# Patient Record
Sex: Male | Born: 1990 | Race: White | Hispanic: No | Marital: Married | Smoking: Never smoker
Health system: Southern US, Community
[De-identification: ages and names within clinical notes are randomized; demographics above are authoritative.]

## PROBLEM LIST (undated history)

## (undated) DIAGNOSIS — F909 Attention-deficit hyperactivity disorder, unspecified type: Secondary | ICD-10-CM

## (undated) DIAGNOSIS — J45909 Unspecified asthma, uncomplicated: Secondary | ICD-10-CM

## (undated) DIAGNOSIS — R030 Elevated blood-pressure reading, without diagnosis of hypertension: Secondary | ICD-10-CM

## (undated) DIAGNOSIS — K649 Unspecified hemorrhoids: Secondary | ICD-10-CM

## (undated) HISTORY — PX: ADENOIDECTOMY: SUR15

---
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
## 2012-02-12 DIAGNOSIS — F902 Attention-deficit hyperactivity disorder, combined type: Secondary | ICD-10-CM | POA: Insufficient documentation

## 2012-02-12 DIAGNOSIS — J301 Allergic rhinitis due to pollen: Secondary | ICD-10-CM | POA: Insufficient documentation

## 2012-02-12 DIAGNOSIS — J45909 Unspecified asthma, uncomplicated: Secondary | ICD-10-CM | POA: Insufficient documentation

## 2013-11-03 DIAGNOSIS — G4733 Obstructive sleep apnea (adult) (pediatric): Secondary | ICD-10-CM | POA: Insufficient documentation

## 2015-09-08 DIAGNOSIS — F5102 Adjustment insomnia: Secondary | ICD-10-CM | POA: Insufficient documentation

## 2015-09-08 DIAGNOSIS — G4733 Obstructive sleep apnea (adult) (pediatric): Secondary | ICD-10-CM | POA: Diagnosis not present

## 2015-09-08 DIAGNOSIS — Z79899 Other long term (current) drug therapy: Secondary | ICD-10-CM | POA: Diagnosis not present

## 2015-09-08 DIAGNOSIS — F902 Attention-deficit hyperactivity disorder, combined type: Secondary | ICD-10-CM | POA: Diagnosis not present

## 2016-01-01 DIAGNOSIS — F4322 Adjustment disorder with anxiety: Secondary | ICD-10-CM | POA: Diagnosis not present

## 2016-01-15 DIAGNOSIS — H5203 Hypermetropia, bilateral: Secondary | ICD-10-CM | POA: Diagnosis not present

## 2016-01-29 DIAGNOSIS — F4322 Adjustment disorder with anxiety: Secondary | ICD-10-CM | POA: Diagnosis not present

## 2016-02-08 DIAGNOSIS — J4520 Mild intermittent asthma, uncomplicated: Secondary | ICD-10-CM | POA: Diagnosis not present

## 2016-02-08 DIAGNOSIS — R202 Paresthesia of skin: Secondary | ICD-10-CM | POA: Diagnosis not present

## 2016-02-08 DIAGNOSIS — F5102 Adjustment insomnia: Secondary | ICD-10-CM | POA: Diagnosis not present

## 2016-02-08 DIAGNOSIS — F902 Attention-deficit hyperactivity disorder, combined type: Secondary | ICD-10-CM | POA: Diagnosis not present

## 2016-05-24 DIAGNOSIS — F902 Attention-deficit hyperactivity disorder, combined type: Secondary | ICD-10-CM | POA: Diagnosis not present

## 2016-05-24 DIAGNOSIS — R6889 Other general symptoms and signs: Secondary | ICD-10-CM | POA: Diagnosis not present

## 2016-06-05 ENCOUNTER — Emergency Department
Admission: EM | Admit: 2016-06-05 | Discharge: 2016-06-05 | Disposition: A | Discharge status: Home / Self Care | Payer: 59 | Attending: Emergency Medicine | Admitting: Emergency Medicine

## 2016-06-05 ENCOUNTER — Encounter: Payer: Self-pay | Admitting: Emergency Medicine

## 2016-06-05 DIAGNOSIS — F909 Attention-deficit hyperactivity disorder, unspecified type: Secondary | ICD-10-CM | POA: Diagnosis not present

## 2016-06-05 DIAGNOSIS — J45909 Unspecified asthma, uncomplicated: Secondary | ICD-10-CM | POA: Diagnosis not present

## 2016-06-05 DIAGNOSIS — K644 Residual hemorrhoidal skin tags: Secondary | ICD-10-CM | POA: Insufficient documentation

## 2016-06-05 DIAGNOSIS — K625 Hemorrhage of anus and rectum: Secondary | ICD-10-CM

## 2016-06-05 DIAGNOSIS — K649 Unspecified hemorrhoids: Secondary | ICD-10-CM

## 2016-06-05 HISTORY — DX: Unspecified hemorrhoids: K64.9

## 2016-06-05 HISTORY — DX: Elevated blood-pressure reading, without diagnosis of hypertension: R03.0

## 2016-06-05 HISTORY — DX: Unspecified asthma, uncomplicated: J45.909

## 2016-06-05 HISTORY — DX: Attention-deficit hyperactivity disorder, unspecified type: F90.9

## 2016-06-05 LAB — TYPE AND SCREEN
ABO/RH(D): O POS
Antibody Screen: NEGATIVE

## 2016-06-05 LAB — COMPREHENSIVE METABOLIC PANEL
ALT: 43 U/L (ref 17–63)
ANION GAP: 6 (ref 5–15)
AST: 29 U/L (ref 15–41)
Albumin: 4.3 g/dL (ref 3.5–5.0)
Alkaline Phosphatase: 79 U/L (ref 38–126)
BUN: 13 mg/dL (ref 6–20)
CHLORIDE: 107 mmol/L (ref 101–111)
CO2: 26 mmol/L (ref 22–32)
Calcium: 9.4 mg/dL (ref 8.9–10.3)
Creatinine, Ser: 0.82 mg/dL (ref 0.61–1.24)
Glucose, Bld: 92 mg/dL (ref 65–99)
POTASSIUM: 4.2 mmol/L (ref 3.5–5.1)
Sodium: 139 mmol/L (ref 135–145)
Total Bilirubin: 0.5 mg/dL (ref 0.3–1.2)
Total Protein: 8.3 g/dL — ABNORMAL HIGH (ref 6.5–8.1)

## 2016-06-05 LAB — CBC
HCT: 47.8 % (ref 40.0–52.0)
Hemoglobin: 16.3 g/dL (ref 13.0–18.0)
MCH: 28.8 pg (ref 26.0–34.0)
MCHC: 34.2 g/dL (ref 32.0–36.0)
MCV: 84.1 fL (ref 80.0–100.0)
Platelets: 246 10*3/uL (ref 150–440)
RBC: 5.68 MIL/uL (ref 4.40–5.90)
RDW: 13.1 % (ref 11.5–14.5)
WBC: 6.8 10*3/uL (ref 3.8–10.6)

## 2016-06-05 NOTE — ED Notes (Signed)

## 2016-06-05 NOTE — ED Triage Notes (Signed)
Pt presents to ED c/o rectal bleeding x1.5 wk. Pt states he has had bleeding from hemorrhoids in the past but it has been less then half the current quantity per day and only lasted 1 day. Denies SOB, dizziness, or lightheadedness.

## 2016-06-05 NOTE — Discharge Instructions (Signed)
Follow-up with your primary doctor or general surgery.  You were seen in the emergency room for abdominal pain. It is important that you follow up closely with your primary care doctor in the next couple of days.  Please return to the emergency room right away if you are to develop a fever, severe nausea, severe or concerning abdominal pain, you are unable to keep food down, begin vomiting any dark or bloody fluid, you develop any worsening of a dark or bloody stools, feel dehydrated, or other new concerns or symptoms arise.

## 2016-06-05 NOTE — ED Provider Notes (Signed)
[facility] Emergency Department Provider Note   ____________________________________________   First MD Initiated Contact with Patient 06/05/16 1403     (approximate)  I have reviewed the triage vital signs and the nursing notes.   HISTORY  Chief Complaint Rectal Bleeding    HPI Roger Bender is a 26 y.o. male reports that he had "a flulike illness" about 2 weeks ago. He had diarrhea during that time, all the symptoms are resolved. Reports he's often constipated. He had been using Imodium. In the past he has had hemorrhoids which have caused some small amounts of bleeding  Reports he thinks he might of bleeding hemorrhoids again, he has had about a bowel movement daily for the last 5 days where he is noticed small drops of blood around his bottom and in the toilet after stooling. Tends to go away after he stands up. No nausea or vomiting. No fevers or chills. No lightheadedness chest pain or weakness. Describes the blood as a small amount, but happening frequently for the last several days  Rectal foreign bodies. No history of early colon cancers. Denies any trauma or injury.   Past Medical History:  Diagnosis Date  . ADHD   . Asthma   . Hemorrhoids   . Prehypertension     Patient Active Problem List   Diagnosis Date Noted  . ATTENTION DEFICIT DISORDER, INATTENTIVE TYPE 10/06/2006  . ASTHMA 10/06/2006    Past Surgical History:  Procedure Laterality Date  . ADENOIDECTOMY      Prior to Admission medications   Not on File    Allergies Patient has no known allergies.  History reviewed. No pertinent family history.  Social History Social History  Substance Use Topics  . Smoking status: Never Smoker  . Smokeless tobacco: Never Used  . Alcohol use No    Review of Systems Constitutional: No fever/chills ENT: No sore throat. Cardiovascular: Denies chest pain. Respiratory: Denies shortness of breath. Gastrointestinal: No abdominal  pain.  No nausea, no vomiting.  No diarrhea.  No constipation. Genitourinary: Negative for dysuria. Musculoskeletal: Negative for back pain. Skin: Negative for rash.  10-point ROS otherwise negative.  ____________________________________________   PHYSICAL EXAM:  VITAL SIGNS: ED Triage Vitals  Enc Vitals Group     BP 06/05/16 1153 (!) 145/93     Pulse Rate 06/05/16 1153 88     Resp 06/05/16 1153 18     Temp 06/05/16 1153 98.9 F (37.2 C)     Temp Source 06/05/16 1153 Oral     SpO2 06/05/16 1153 98 %     Weight 06/05/16 1153 250 lb (113.4 kg)     Height 06/05/16 1153 6' (1.829 m)     Head Circumference --      Peak Flow --      Pain Score 06/05/16 1156 1     Pain Loc --      Pain Edu? --      Excl. in GC? --     Constitutional: Alert and oriented. Well appearing and in no acute distress. Eyes: Conjunctivae are normal. PERRL. EOMI. Head: Atraumatic. Nose: No congestion/rhinnorhea. Mouth/Throat: Mucous membranes are moist.  Oropharynx non-erythematous. Neck: No stridor.   Cardiovascular: Normal rate, regular rhythm. Grossly normal heart sounds.  Good peripheral circulation. Respiratory: Normal respiratory effort.  No retractions. Lungs CTAB. Gastrointestinal: Soft and nontender.  Rectal examination demonstrates no stool in the vault presently. Heme-negative stool. No bleeding at this time. There is a small remainder of an  old external hemorrhoid. Musculoskeletal: No lower extremity tenderness nor edema.   Neurologic:  Normal speech and language. No gross focal neurologic deficits are appreciated. Skin:  Skin is warm, dry and intact. No rash noted. Psychiatric: Mood and affect are normal. Speech and behavior are normal.  ____________________________________________   LABS (all labs ordered are listed, but only abnormal results are displayed)  Labs Reviewed  COMPREHENSIVE METABOLIC PANEL - Abnormal; Notable for the following:       Result Value   Total Protein 8.3 (*)     All other components within normal limits  CBC  POC OCCULT BLOOD, ED  TYPE AND SCREEN   ____________________________________________  EKG   ____________________________________________  RADIOLOGY   ____________________________________________   PROCEDURES  Procedure(s) performed: None  Procedures  Critical Care performed: No  ____________________________________________   INITIAL IMPRESSION / ASSESSMENT AND PLAN / ED COURSE  Pertinent labs & imaging results that were available during my care of the patient were reviewed by me and considered in my medical decision making (see chart for details).  Rectal bleeding. Appears mild, likely due to internal hemorrhoids or other hemorrhoidal source. Patient hemodynamically stable, no evidence of ongoing bleeding at this time. No associated abdominal pain or systemic symptoms. Blood counts reviewed and reassuring/normal.  Discussed with patient treatment recommendations, traditional hemorrhoid treatment, and also advised follow-up with his primary care doctor as well as general surgery for further workup including a recommendation to consider having a colonoscopy after being seen and reevaluated by his primary.  Return precautions and treatment recommendations and follow-up discussed with the patient who is agreeable with the plan.      ____________________________________________   FINAL CLINICAL IMPRESSION(S) / ED DIAGNOSES  Final diagnoses:  Rectal bleeding  Residual hemorrhoidal skin tags  Hemorrhoids, unspecified hemorrhoid type      NEW MEDICATIONS STARTED DURING THIS VISIT:  New Prescriptions   No medications on file     Note:  This document was prepared using Dragon voice recognition software and may include unintentional dictation errors.     Sharyn CreamerMark Matilynn Dacey, MD 06/05/16 [phone]

## 2017-06-25 DIAGNOSIS — F5101 Primary insomnia: Secondary | ICD-10-CM | POA: Insufficient documentation

## 2018-04-13 ENCOUNTER — Ambulatory Visit (INDEPENDENT_AMBULATORY_CARE_PROVIDER_SITE_OTHER): Payer: BC Managed Care – PPO

## 2018-04-13 ENCOUNTER — Ambulatory Visit
Admission: EM | Admit: 2018-04-13 | Discharge: 2018-04-13 | Disposition: A | Discharge status: Home / Self Care | Payer: BC Managed Care – PPO | Attending: Family Medicine | Admitting: Family Medicine

## 2018-04-13 DIAGNOSIS — I10 Essential (primary) hypertension: Secondary | ICD-10-CM

## 2018-04-13 DIAGNOSIS — J069 Acute upper respiratory infection, unspecified: Secondary | ICD-10-CM

## 2018-04-13 DIAGNOSIS — J4520 Mild intermittent asthma, uncomplicated: Secondary | ICD-10-CM | POA: Diagnosis not present

## 2018-04-13 LAB — RAPID STREP SCREEN (MED CTR MEBANE ONLY): Streptococcus, Group A Screen (Direct): NEGATIVE

## 2018-04-13 MED ORDER — BENZONATATE 200 MG PO CAPS: ORAL_CAPSULE | ORAL | 0 refills | Status: DC

## 2018-04-13 MED ORDER — HYDROCOD POLST-CPM POLST ER 10-8 MG/5ML PO SUER: 5.0000 mL | Freq: Two times a day (BID) | ORAL | 0 refills | Status: DC

## 2018-04-13 MED ORDER — AZITHROMYCIN 250 MG PO TABS: 250.0000 mg | ORAL_TABLET | Freq: Every day | ORAL | 0 refills | Status: DC

## 2018-04-13 NOTE — Discharge Instructions (Addendum)
Drink plenty of fluids.  Rest as much as possible.  Use Tylenol or Motrin for fever chills or body aches.  °

## 2018-04-13 NOTE — ED Provider Notes (Signed)
[facility]    CSN: [id number] Arrival date & time: 04/13/18  1243     History   Chief Complaint Chief Complaint  Patient presents with  . Chills    HPI Roger Bender is a 28 y.o. male.   HPI  28 year old male history of asthma and obstructive sleep apnea with cold sweats that he had at night for the past 5 days.  White patches on his tongue and throat it became more concerned for strep.  He also complains of headache nausea and fatigue.  Question today about his worst symptom it is coughing that he is most concerned about and bothered with.  Had shaking chills but has not taken his temperature.  He is used his albuterol inhaler only twice.  Temperature today is 99.3 pulse rate of 123 blood pressure 134/107 respirations 18 O2 sats of 97%.  He states that he did receive his flu shot in November.        Past Medical History:  Diagnosis Date  . ADHD   . Asthma   . Hemorrhoids   . Prehypertension     Patient Active Problem List   Diagnosis Date Noted  . Primary insomnia 06/25/2017  . OSA (obstructive sleep apnea) 11/03/2013  . Asthma 02/12/2012  . Hay fever 02/12/2012  . ATTENTION DEFICIT DISORDER, INATTENTIVE TYPE 10/06/2006  . ASTHMA 10/06/2006    Past Surgical History:  Procedure Laterality Date  . ADENOIDECTOMY         Home Medications    Prior to Admission medications   Medication Sig Start Date End Date Taking? Authorizing Provider  albuterol (PROVENTIL HFA;VENTOLIN HFA) 108 (90 Base) MCG/ACT inhaler INHALE 2 PUFFS BY MOUTH INTO THE LUNGS EVERY 4 HOURS AS NEEDED 08/01/17  Yes [provider]  amphetamine-dextroamphetamine (ADDERALL) 20 MG tablet Take by mouth. 02/04/18 05/06/18 Yes [provider]  traZODone (DESYREL) 100 MG tablet Take by mouth. 09/29/17 09/29/18 Yes [provider]  azithromycin (ZITHROMAX) 250 MG tablet Take 1 tablet (250 mg total) by mouth daily. Take first 2 tablets together, then 1 every day until  finished. 04/13/18   Lutricia Feil, PA-C  benzonatate (TESSALON) 200 MG capsule Take one cap TID PRN cough 04/13/18   Lutricia Feil, PA-C  chlorpheniramine-HYDROcodone [facility]) 10-8 MG/5ML SUER Take 5 mLs by mouth 2 (two) times daily. 04/13/18   Lutricia Feil, PA-C    Family History Family History  Problem Relation Age of Onset  . Healthy Mother   . Depression Father     Social History Social History   Tobacco Use  . Smoking status: Never Smoker  . Smokeless tobacco: Never Used  Substance Use Topics  . Alcohol use: No  . Drug use: No     Allergies   Patient has no known allergies.   Review of Systems Review of Systems  Constitutional: Positive for activity change, chills and fatigue. Negative for appetite change, diaphoresis and fever.  HENT: Positive for congestion and mouth sores.   Respiratory: Positive for cough and shortness of breath.   All other systems reviewed and are negative.    Physical Exam Triage Vital Signs ED Triage Vitals  Enc Vitals Group     BP 04/13/18 1258 (!) 134/107     Pulse Rate 04/13/18 1257 (!) 123     Resp 04/13/18 1257 18     Temp 04/13/18 1257 99.3 F (37.4 C)     Temp Source 04/13/18 1257 Oral  SpO2 04/13/18 1257 97 %     Weight 04/13/18 1259 260 lb (117.9 kg)     Height 04/13/18 1259 5' 11.5" (1.816 m)     Head Circumference --      Peak Flow --      Pain Score 04/13/18 1259 0     Pain Loc --      Pain Edu? --      Excl. in GC? --    No data found.  Updated Vital Signs BP (!) 134/107   Pulse (!) 123   Temp 99.3 F (37.4 C) (Oral)   Resp 18   Ht 5' 11.5" (1.816 m)   Wt 260 lb (117.9 kg)   SpO2 97%   BMI 35.76 kg/m   Visual Acuity Right Eye Distance:   Left Eye Distance:   Bilateral Distance:    Right Eye Near:   Left Eye Near:    Bilateral Near:     Physical Exam Vitals signs and nursing note reviewed.  Constitutional:      General: He is not in acute distress.     Appearance: Normal appearance. He is obese. He is ill-appearing. He is not toxic-appearing or diaphoretic.  HENT:     Head: Normocephalic.     Right Ear: Tympanic membrane, ear canal and external ear normal.     Left Ear: Tympanic membrane, ear canal and external ear normal.     Nose: Nose normal. No congestion or rhinorrhea.     Mouth/Throat:     Mouth: Mucous membranes are moist.  Eyes:     General:        Right eye: No discharge.        Left eye: No discharge.     Conjunctiva/sclera: Conjunctivae normal.  Neck:     Musculoskeletal: Normal range of motion and neck supple.  Pulmonary:     Effort: Pulmonary effort is normal.     Breath sounds: Rhonchi present.     Comments: Patient has very harsh breath sounds over the right middle lower lobe. Musculoskeletal: Normal range of motion.  Lymphadenopathy:     Cervical: No cervical adenopathy.  Skin:    General: Skin is warm and dry.  Neurological:     General: No focal deficit present.     Mental Status: He is alert and oriented to person, place, and time.  Psychiatric:        Mood and Affect: Mood normal.        Behavior: Behavior normal.        Thought Content: Thought content normal.        Judgment: Judgment normal.      UC Treatments / Results  Labs (all labs ordered are listed, but only abnormal results are displayed) Labs Reviewed  RAPID STREP SCREEN ([facility] ONLY)  CULTURE, GROUP A STREP [facility])    EKG None  Radiology Dg Chest 2 View  Result Date: 04/13/2018 CLINICAL DATA:  28 year old male with cough EXAM: CHEST - 2 VIEW COMPARISON:  Prior chest x-ray 11/30/2007 FINDINGS: The lungs are clear and negative for focal airspace consolidation, pulmonary edema or suspicious pulmonary nodule. No pleural effusion or pneumothorax. Cardiac and mediastinal contours are within normal limits. No acute fracture or lytic or blastic osseous lesions. The visualized upper abdominal bowel gas pattern is unremarkable.  IMPRESSION: Normal chest x-ray. Electronically Signed   By: Malachy MoanHeath  McCullough M.D.   On: 04/13/2018 15:10    Procedures Procedures (including critical care time)  Medications Ordered in UC Medications - No data to display  Initial Impression / Assessment and Plan / UC Course  I have reviewed the triage vital signs and the nursing notes.  Pertinent labs & imaging results that were available during my care of the patient were reviewed by me and considered in my medical decision making (see chart for details).   The patient he likely may have had flulike symptoms.  To late actually to initiate any antiviral medication.  However he does have the history of asthma in the tachycardia with 97% O2 sat.  We will start him empirically on azithromycin.  We will provide him with cough suppressants with Tessalon Perles and Tussionex.  Cultures and sensitivities of the pharynx will be available in 48 hours.  Also recommended that he use Flonase nasal spray for 2 to 3 weeks.   Final Clinical Impressions(s) / UC Diagnoses   Final diagnoses:  Upper respiratory tract infection, unspecified type  Mild intermittent asthma without complication     Discharge Instructions     Drink plenty of fluids.  Rest as much as possible.  Use Tylenol or Motrin for fever chills or body aches.    ED Prescriptions    Medication Sig Dispense Auth. Provider   azithromycin (ZITHROMAX) 250 MG tablet Take 1 tablet (250 mg total) by mouth daily. Take first 2 tablets together, then 1 every day until finished. 6 tablet Lutricia Feil, PA-C   benzonatate (TESSALON) 200 MG capsule Take one cap TID PRN cough 30 capsule Lutricia Feil, PA-C   chlorpheniramine-HYDROcodone (TUSSIONEX PENNKINETIC ER) 10-8 MG/5ML SUER Take 5 mLs by mouth 2 (two) times daily. 115 mL Lutricia Feil, PA-C     Controlled Substance Prescriptions [place] Controlled Substance Registry consulted? Not Applicable   Lutricia Feil, PA-C 04/13/18  1628

## 2018-04-13 NOTE — ED Triage Notes (Signed)
Pt here for cold sweats at night for the past 5 days. Want to rule out strep, has white patches on his tongue. Also has headache, nausea and fatigue. No otc meds taken, no fever reported.

## 2018-04-16 ENCOUNTER — Telehealth (HOSPITAL_COMMUNITY): Payer: Self-pay | Admitting: Emergency Medicine

## 2018-04-16 LAB — CULTURE, GROUP A STREP (THRC)

## 2018-04-16 NOTE — Telephone Encounter (Signed)
Non group A strep on culture, pt contacted states he is feeling better but cough still is lingering, asking if its okay to double up on the cough syrup. Per Dr. Leonides Grills, okay to double up on cough medicine and to follow up with PCP if symptoms persist. Pt verbalized understanding and had all questions answered.

## 2018-09-07 ENCOUNTER — Other Ambulatory Visit: Payer: Self-pay

## 2018-09-07 ENCOUNTER — Ambulatory Visit (INDEPENDENT_AMBULATORY_CARE_PROVIDER_SITE_OTHER): Payer: BC Managed Care – PPO

## 2018-09-07 ENCOUNTER — Ambulatory Visit
Admission: EM | Admit: 2018-09-07 | Discharge: 2018-09-07 | Disposition: A | Discharge status: Home / Self Care | Payer: BC Managed Care – PPO | Attending: Emergency Medicine | Admitting: Emergency Medicine

## 2018-09-07 DIAGNOSIS — S93402A Sprain of unspecified ligament of left ankle, initial encounter: Secondary | ICD-10-CM

## 2018-09-07 DIAGNOSIS — W1840XA Slipping, tripping and stumbling without falling, unspecified, initial encounter: Secondary | ICD-10-CM | POA: Diagnosis not present

## 2018-09-07 MED ORDER — IBUPROFEN 800 MG PO TABS: 800.0000 mg | ORAL_TABLET | Freq: Three times a day (TID) | ORAL | 0 refills | Status: AC

## 2018-09-07 NOTE — ED Provider Notes (Signed)
[facility]    CSN: [id number] Arrival date & time: 09/07/18  1314     History   Chief Complaint Chief Complaint  Patient presents with  . Ankle Injury    HPI Roger Bender is a 28 y.o. male presenting with pain to left foot and ankle. Pt was walking down stairs when he slipped and rolled his ankle inward.Injuiry happened approximately 90 mins ago. Pt denied hearing or feeling a pop or crack. Pt states he has decreased ability to bear weight due to injury. No medication/therapies have been tried since injury.  Past Medical History:  Diagnosis Date  . ADHD   . Asthma   . Hemorrhoids   . Prehypertension     Patient Active Problem List   Diagnosis Date Noted  . Primary insomnia 06/25/2017  . OSA (obstructive sleep apnea) 11/03/2013  . Asthma 02/12/2012  . Hay fever 02/12/2012  . ATTENTION DEFICIT DISORDER, INATTENTIVE TYPE 10/06/2006  . ASTHMA 10/06/2006    Past Surgical History:  Procedure Laterality Date  . ADENOIDECTOMY         Home Medications    Prior to Admission medications   Medication Sig Start Date End Date Taking? Authorizing Provider  albuterol (PROVENTIL HFA;VENTOLIN HFA) 108 (90 Base) MCG/ACT inhaler INHALE 2 PUFFS BY MOUTH INTO THE LUNGS EVERY 4 HOURS AS NEEDED 08/01/17  Yes [provider]  amphetamine-dextroamphetamine (ADDERALL) 20 MG tablet Take by mouth. 02/04/18 09/07/18 Yes [provider]  escitalopram (LEXAPRO) 20 MG tablet  08/18/18  Yes [provider]  gabapentin (NEURONTIN) 100 MG capsule TK 1 TO 3 CS PO QD PRA 07/21/18  Yes [provider]  guanFACINE (INTUNIV) 2 MG TB24 ER tablet TK 1 T PO QAM 07/21/18  Yes [provider]  LORazepam (ATIVAN) 1 MG tablet  05/08/18  Yes [provider]  traZODone (DESYREL) 100 MG tablet Take by mouth. 09/29/17 09/29/18 Yes [provider]  azithromycin (ZITHROMAX) 250 MG tablet Take 1 tablet (250 mg total) by mouth daily. Take first 2  tablets together, then 1 every day until finished. 04/13/18   Lorin Picket, PA-C  benzonatate (TESSALON) 200 MG capsule Take one cap TID PRN cough 04/13/18   Lorin Picket, PA-C  chlorpheniramine-HYDROcodone [facility]) 10-8 MG/5ML SUER Take 5 mLs by mouth 2 (two) times daily. 04/13/18   Lorin Picket, PA-C  ibuprofen (ADVIL) 800 MG tablet Take 1 tablet (800 mg total) by mouth 3 (three) times daily with meals for 10 days. 09/07/18 09/17/18  Gertie Baron, NP    Family History Family History  Problem Relation Age of Onset  . Healthy Mother   . Depression Father     Social History Social History   Tobacco Use  . Smoking status: Never Smoker  . Smokeless tobacco: Never Used  Substance Use Topics  . Alcohol use: No  . Drug use: No     Allergies   Patient has no known allergies.   Review of Systems Review of Systems  Musculoskeletal: Positive for arthralgias. Negative for joint swelling.  Skin: Negative for color change and pallor.  All other systems reviewed and are negative.    Physical Exam Triage Vital Signs ED Triage Vitals  Enc Vitals Group     BP 09/07/18 1349 (!) 134/96     Pulse Rate 09/07/18 1349 88     Resp 09/07/18 1349 18     Temp 09/07/18 1349 98.8 F (37.1 C)  Temp Source 09/07/18 1349 Oral     SpO2 09/07/18 1349 99 %     Weight 09/07/18 1347 260 lb (117.9 kg)     Height 09/07/18 1347 6' (1.829 m)     Head Circumference --      Peak Flow --      Pain Score 09/07/18 1347 7     Pain Loc --      Pain Edu? --      Excl. in GC? --    No data found.  Updated Vital Signs BP (!) 134/96 (BP Location: Left Arm)   Pulse 88   Temp 98.8 F (37.1 C) (Oral)   Resp 18   Ht 6' (1.829 m)   Wt 260 lb (117.9 kg)   SpO2 99%   BMI 35.26 kg/m    Physical Exam Vitals signs reviewed.  Musculoskeletal: Normal range of motion.        General: Tenderness present. No swelling.     Comments: Decreased ability to dorsal flex foot due  to pain. Tenderness to 3rd metatarsal and dorsal aspect.   Skin:    General: Skin is warm and dry.     Capillary Refill: Capillary refill takes less than 2 seconds.     Findings: No bruising or erythema.      UC Treatments / Results  Labs (all labs ordered are listed, but only abnormal results are displayed) Labs Reviewed - No data to display  EKG None  Radiology Dg Ankle Complete Left  Result Date: 09/07/2018 CLINICAL DATA:  Anterior ankle and dorsal foot pain after falling down steps today. Initial encounter. EXAM: LEFT ANKLE COMPLETE - 3+ VIEW COMPARISON:  None. FINDINGS: The mineralization and alignment are normal. There is no evidence of acute fracture or dislocation. The joint spaces are preserved. No focal soft tissue swelling identified. IMPRESSION: No evidence of acute fracture or dislocation. Electronically Signed   By: Carey BullocksWilliam  Veazey M.D.   On: 09/07/2018 14:46   Dg Foot Complete Left  Result Date: 09/07/2018 CLINICAL DATA:  Anterior ankle and dorsal foot pain after falling down steps today. Initial encounter. EXAM: LEFT FOOT - COMPLETE 3+ VIEW COMPARISON:  None. FINDINGS: The mineralization and alignment are normal. There is no evidence of acute fracture or dislocation. The joint spaces are preserved. No focal soft tissue swelling identified. IMPRESSION: No evidence of acute fracture or dislocation. Electronically Signed   By: Carey BullocksWilliam  Veazey M.D.   On: 09/07/2018 14:47    Procedures Procedures (including critical care time)  Medications Ordered in UC Medications - No data to display  Initial Impression / Assessment and Plan / UC Course  I have reviewed the triage vital signs and the nursing notes.  Pertinent labs & imaging results that were available during my care of the patient were reviewed by me and considered in my medical decision making (see chart for details).    Pt presented with foot and ankle post fall this afternoon. Subsequent Xray show no acute  osseous injury. Pain and mechanism of injury consistent with an ankle sprain/ Pt educated on RICE therapy, given a lace-up ankle splint in office and prescribed 800mg  ibuprofen TID #30. Pt to follow-up with PCP if pain is unchanging/worseing. All questions answered and all concerns addressed.   Final Clinical Impressions(s) / UC Diagnoses   Final diagnoses:  Sprain of left ankle, unspecified ligament, initial encounter   Discharge Instructions   None    ED Prescriptions    Medication Sig Dispense Auth.  Provider   ibuprofen (ADVIL) 800 MG tablet Take 1 tablet (800 mg total) by mouth 3 (three) times daily with meals for 10 days. 30 tablet Bailey MechBenjamin, Navayah Sok, NP       Bailey MechBenjamin, Joshau Code, NP 09/07/18 [phone]

## 2018-09-07 NOTE — ED Triage Notes (Signed)
Patient states that he rolled his left ankle while walking down some steps at [employer]. Patient states that area hurts to put weight on it and hurts to move his toes. States that this happened around 1.5 hours ago.

## 2019-09-04 ENCOUNTER — Other Ambulatory Visit: Payer: Self-pay

## 2019-09-04 ENCOUNTER — Ambulatory Visit
Admission: EM | Admit: 2019-09-04 | Discharge: 2019-09-04 | Disposition: A | Discharge status: Home / Self Care | Payer: BC Managed Care – PPO | Attending: Emergency Medicine | Admitting: Emergency Medicine

## 2019-09-04 ENCOUNTER — Encounter: Payer: Self-pay | Admitting: Emergency Medicine

## 2019-09-04 DIAGNOSIS — R05 Cough: Secondary | ICD-10-CM | POA: Insufficient documentation

## 2019-09-04 DIAGNOSIS — J4521 Mild intermittent asthma with (acute) exacerbation: Secondary | ICD-10-CM | POA: Diagnosis not present

## 2019-09-04 DIAGNOSIS — Z20822 Contact with and (suspected) exposure to covid-19: Secondary | ICD-10-CM | POA: Insufficient documentation

## 2019-09-04 DIAGNOSIS — R059 Cough, unspecified: Secondary | ICD-10-CM

## 2019-09-04 DIAGNOSIS — G4700 Insomnia, unspecified: Secondary | ICD-10-CM | POA: Insufficient documentation

## 2019-09-04 DIAGNOSIS — Z79899 Other long term (current) drug therapy: Secondary | ICD-10-CM | POA: Diagnosis not present

## 2019-09-04 DIAGNOSIS — R0981 Nasal congestion: Secondary | ICD-10-CM | POA: Diagnosis not present

## 2019-09-04 DIAGNOSIS — J0110 Acute frontal sinusitis, unspecified: Secondary | ICD-10-CM | POA: Insufficient documentation

## 2019-09-04 MED ORDER — BENZONATATE 200 MG PO CAPS: ORAL_CAPSULE | ORAL | 0 refills | Status: DC

## 2019-09-04 MED ORDER — AMOXICILLIN-POT CLAVULANATE 875-125 MG PO TABS: 1.0000 | ORAL_TABLET | Freq: Two times a day (BID) | ORAL | 0 refills | Status: AC

## 2019-09-04 MED ORDER — HYDROCOD POLST-CPM POLST ER 10-8 MG/5ML PO SUER: 5.0000 mL | Freq: Every evening | ORAL | 0 refills | Status: DC | PRN

## 2019-09-04 MED ORDER — ALBUTEROL SULFATE HFA 108 (90 BASE) MCG/ACT IN AERS: 2.0000 | INHALATION_SPRAY | Freq: Four times a day (QID) | RESPIRATORY_TRACT | 2 refills | Status: DC | PRN

## 2019-09-04 NOTE — ED Triage Notes (Signed)
Patient c/o cough and congestion for over a week.  Patient denies recent fevers.  

## 2019-09-04 NOTE — ED Provider Notes (Signed)
[facility]    CSN: [id number] Arrival date & time: 09/04/19  0802      History   Chief Complaint Chief Complaint  Patient presents with  . Cough    HPI Roger Bender is a 29 y.o. male.   29 year old male presents with nasal congestion, productive cough with yellow to green mucus for over 1 week. Had a slight fever last week but no fever now. Also having right ear fullness and sinus pressure/headaches. Denies any GI symptoms. Also has history of asthma and has noticed more wheezing in the past few days- has run out of his Albuterol inhaler. Has tried OTC cold medication and Nyquil with no relief. Father in law also sick with similar symptoms. Son was sick with URI symptoms last week but resolving now. He was positive for COVID 19 in Dec 2020 and current symptoms feel different. Has had both doses of COVID 19 vaccine. Other chronic health issues include ADHD and insomnia. Currently on Lexapro, Gabapentin, Guanfacine daily and Adderall prn.   The history is provided by the patient.    Past Medical History:  Diagnosis Date  . ADHD   . Asthma   . Hemorrhoids   . Prehypertension     Patient Active Problem List   Diagnosis Date Noted  . Primary insomnia 06/25/2017  . OSA (obstructive sleep apnea) 11/03/2013  . Asthma 02/12/2012  . Hay fever 02/12/2012  . ATTENTION DEFICIT DISORDER, INATTENTIVE TYPE 10/06/2006  . ASTHMA 10/06/2006    Past Surgical History:  Procedure Laterality Date  . ADENOIDECTOMY         Home Medications    Prior to Admission medications   Medication Sig Start Date End Date Taking? Authorizing Provider  amphetamine-dextroamphetamine (ADDERALL) 20 MG tablet Take by mouth. 02/04/18 09/04/19 Yes [provider]  escitalopram (LEXAPRO) 20 MG tablet  08/18/18  Yes [provider]  gabapentin (NEURONTIN) 100 MG capsule TK 1 TO 3 CS PO QD PRA 07/21/18  Yes [provider]  guanFACINE (INTUNIV) 2 MG TB24 ER tablet TK 1  T PO QAM 07/21/18  Yes [provider]  albuterol (VENTOLIN HFA) 108 (90 Base) MCG/ACT inhaler Inhale 2 puffs into the lungs every 6 (six) hours as needed for wheezing or shortness of breath. 09/04/19   Sudie Grumbling, NP  amoxicillin-clavulanate (AUGMENTIN) 875-125 MG tablet Take 1 tablet by mouth every 12 (twelve) hours for 7 days. 09/04/19 09/11/19  Sudie Grumbling, NP  benzonatate (TESSALON) 200 MG capsule Take one capsule by mouth three times a day as needed for cough 09/04/19   Sudie Grumbling, NP  chlorpheniramine-HYDROcodone (TUSSIONEX PENNKINETIC ER) 10-8 MG/5ML SUER Take 5 mLs by mouth at bedtime as needed for cough. 09/04/19   Sudie Grumbling, NP  traZODone (DESYREL) 100 MG tablet Take by mouth. 09/29/17 09/29/18  [provider]    Family History Family History  Problem Relation Age of Onset  . Healthy Mother   . Depression Father     Social History Social History   Tobacco Use  . Smoking status: Never Smoker  . Smokeless tobacco: Never Used  Vaping Use  . Vaping Use: Never used  Substance Use Topics  . Alcohol use: No  . Drug use: No     Allergies   Patient has no known allergies.   Review of Systems Review of Systems  Constitutional: Positive for fatigue. Negative for activity change, appetite change, chills and fever.  HENT: Positive for  congestion, ear pain (right ear fullness), postnasal drip, sinus pressure and sinus pain. Negative for ear discharge, facial swelling, mouth sores, nosebleeds, sneezing, sore throat and trouble swallowing.   Eyes: Negative for pain, discharge, redness and itching.  Respiratory: Positive for cough and wheezing. Negative for chest tightness.   Gastrointestinal: Negative for abdominal pain, diarrhea, nausea and vomiting.  Musculoskeletal: Negative for arthralgias, myalgias, neck pain and neck stiffness.  Skin: Negative for color change, rash and wound.  Allergic/Immunologic: Positive for environmental allergies.  Negative for food allergies and immunocompromised state.  Neurological: Positive for headaches. Negative for dizziness, seizures, syncope, weakness and light-headedness.  Hematological: Negative for adenopathy. Does not bruise/bleed easily.     Physical Exam Triage Vital Signs ED Triage Vitals  Enc Vitals Group     BP 09/04/19 0815 (!) 134/96     Pulse Rate 09/04/19 0815 82     Resp 09/04/19 0815 16     Temp 09/04/19 0815 98.1 F (36.7 C)     Temp Source 09/04/19 0815 Oral     SpO2 09/04/19 0815 97 %     Weight 09/04/19 0813 260 lb (117.9 kg)     Height 09/04/19 0813 5' 11.5" (1.816 m)     Head Circumference --      Peak Flow --      Pain Score 09/04/19 0812 4     Pain Loc --      Pain Edu? --      Excl. in GC? --    No data found.  Updated Vital Signs BP (!) 134/96 (BP Location: Left Arm)   Pulse 82   Temp 98.1 F (36.7 C) (Oral)   Resp 16   Ht 5' 11.5" (1.816 m)   Wt 260 lb (117.9 kg)   SpO2 97%   BMI 35.76 kg/m   Visual Acuity Right Eye Distance:   Left Eye Distance:   Bilateral Distance:    Right Eye Near:   Left Eye Near:    Bilateral Near:     Physical Exam Vitals and nursing note reviewed.  Constitutional:      General: He is awake. He is not in acute distress.    Appearance: He is well-developed and well-groomed. He is ill-appearing.     Comments: He is sitting on the exam table in no acute distress but appears ill.   HENT:     Head: Normocephalic and atraumatic.     Right Ear: Hearing, ear canal and external ear normal. No drainage. A middle ear effusion is present. Tympanic membrane is bulging. Tympanic membrane is not injected or erythematous.     Left Ear: Hearing, ear canal and external ear normal. No drainage. A middle ear effusion is present. Tympanic membrane is bulging. Tympanic membrane is not injected or erythematous.     Nose: Congestion present.     Right Sinus: Frontal sinus tenderness present. No maxillary sinus tenderness.     Left  Sinus: Frontal sinus tenderness present. No maxillary sinus tenderness.     Mouth/Throat:     Lips: Pink.     Mouth: Mucous membranes are moist.     Pharynx: Uvula midline. Oropharyngeal exudate (slight clear to yellow drainage present) and posterior oropharyngeal erythema present. No pharyngeal swelling or uvula swelling.  Eyes:     Extraocular Movements: Extraocular movements intact.     Conjunctiva/sclera: Conjunctivae normal.  Cardiovascular:     Rate and Rhythm: Normal rate and regular rhythm.     Heart sounds:  Normal heart sounds. No murmur heard.   Pulmonary:     Effort: Pulmonary effort is normal. No respiratory distress.     Breath sounds: Normal air entry. No stridor or decreased air movement. Examination of the right-upper field reveals decreased breath sounds and wheezing. Examination of the left-upper field reveals decreased breath sounds and wheezing. Decreased breath sounds and wheezing present. No rhonchi or rales.     Comments: Course breath sounds in upper lung fields.  Musculoskeletal:     Cervical back: Normal range of motion and neck supple. No rigidity.  Lymphadenopathy:     Cervical: No cervical adenopathy.  Skin:    General: Skin is warm and dry.     Capillary Refill: Capillary refill takes less than 2 seconds.     Findings: No rash.  Neurological:     General: No focal deficit present.     Mental Status: He is alert and oriented to person, place, and time.  Psychiatric:        Mood and Affect: Mood normal.        Behavior: Behavior normal. Behavior is cooperative.        Thought Content: Thought content normal.        Judgment: Judgment normal.      UC Treatments / Results  Labs (all labs ordered are listed, but only abnormal results are displayed) Labs Reviewed  SARS CORONAVIRUS 2 (TAT 6-24 HRS)    EKG   Radiology No results found.  Procedures Procedures (including critical care time)  Medications Ordered in UC Medications - No data to  display  Initial Impression / Assessment and Plan / UC Course  I have reviewed the triage vital signs and the nursing notes.  Pertinent labs & imaging results that were available during my care of the patient were reviewed by me and considered in my medical decision making (see chart for details).    Discussed with patient that he appears to have a frontal sinus infection with cough and wheezing associated with asthma exacerbation. Due to length and worsening of symptoms, recommend start Augmentin 875mg  twice a day as directed. Take Tessalon cough pills 1 every 8 hours as needed. Use Albuterol inhaler 2 puffs every 4 to 6 hours as needed for wheezing and cough. Increase fluid intake to help loosen up mucus in chest. May take Tussionex 1 teaspoon at bedtime as needed for cough. Follow-up pending COVID 19 test results and with his PCP in 3 to 4 days if not improving.  Final Clinical Impressions(s) / UC Diagnoses   Final diagnoses:  Acute non-recurrent frontal sinusitis  Cough  Mild intermittent asthma with exacerbation     Discharge Instructions     Recommend start Augmentin 875mg  twice a day for 7 days. May take Benzonatate 1 capsule every 8 hours as needed for cough. Increase fluid intake to help loosen up mucus in chest. Use Albuterol inhaler 2 puffs every 4 to 6 hours as needed for cough and wheezing. May also take Tussionex 1 teaspoon at night to help with cough. Rest. Follow-up pending COVID19 test results and in 3 to 4 days if not improving.     ED Prescriptions    Medication Sig Dispense Auth. Provider   benzonatate (TESSALON) 200 MG capsule Take one capsule by mouth three times a day as needed for cough 21 capsule , NP   amoxicillin-clavulanate (AUGMENTIN) 875-125 MG tablet Take 1 tablet by mouth every 12 (twelve) hours for 7 days. 14 tablet  Katy Apo, NP   albuterol (VENTOLIN HFA) 108 (90 Base) MCG/ACT inhaler Inhale 2 puffs into the lungs every 6 (six)  hours as needed for wheezing or shortness of breath. 18 g Katy Apo, NP   chlorpheniramine-HYDROcodone [facility] ER) 10-8 MG/5ML SUER Take 5 mLs by mouth at bedtime as needed for cough. 70 mL Katy Apo, NP     PDMP was reviewed this encounter. Last active Rx was in Dec 2020 for Ambien. No other current Rx. I feel the benefits outweigh the risks of prescribing a controlled medication at this time.    Katy Apo, NP 09/04/19 2012

## 2019-09-04 NOTE — Discharge Instructions (Addendum)
Recommend start Augmentin 875mg  twice a day for 7 days. May take Benzonatate 1 capsule every 8 hours as needed for cough. Increase fluid intake to help loosen up mucus in chest. Use Albuterol inhaler 2 puffs every 4 to 6 hours as needed for cough and wheezing. May also take Tussionex 1 teaspoon at night to help with cough. Rest. Follow-up pending COVID19 test results and in 3 to 4 days if not improving.

## 2019-09-05 LAB — SARS CORONAVIRUS 2 (TAT 6-24 HRS): SARS Coronavirus 2: NEGATIVE

## 2020-04-03 ENCOUNTER — Ambulatory Visit (INDEPENDENT_AMBULATORY_CARE_PROVIDER_SITE_OTHER): Payer: BC Managed Care – PPO | Admitting: Internal Medicine

## 2020-04-03 VITALS — BP 136/99 | HR 98 | Resp 18 | Ht 72.0 in | Wt 320.0 lb

## 2020-04-03 DIAGNOSIS — R030 Elevated blood-pressure reading, without diagnosis of hypertension: Secondary | ICD-10-CM

## 2020-04-03 DIAGNOSIS — G4733 Obstructive sleep apnea (adult) (pediatric): Secondary | ICD-10-CM | POA: Diagnosis not present

## 2020-04-03 DIAGNOSIS — J4520 Mild intermittent asthma, uncomplicated: Secondary | ICD-10-CM

## 2020-04-03 DIAGNOSIS — Z7189 Other specified counseling: Secondary | ICD-10-CM

## 2020-04-03 DIAGNOSIS — Z9989 Dependence on other enabling machines and devices: Secondary | ICD-10-CM

## 2020-04-03 NOTE — Patient Instructions (Signed)

## 2020-04-03 NOTE — Progress Notes (Signed)
[facility] [address] [place], [place] [address]  Pulmonary Sleep Medicine   Office Visit Note  Patient Name: Roger Bender DOB: 22-Apr-1990 MRN [id number]    Chief Complaint: Obstructive Sleep Apnea visit  Brief History:  Roger Bender is seen today for follow up The patient has a 7 year history of sleep apnea. Patient is using PAP nightly.  The patient feels more rested after sleeping with PAP.  The patient reports benefiting from PAP use. Reported sleepiness is  Improved but is worsening and the Epworth Sleepiness Score is 11 out of 24. The patient does take naps with his kids recently. The patient complains of the following: congested nose and too much air.  The compliance download shows excellent compliance with an average use time of 8.7 hours. The AHI is 2.6  The patient does not complain of limb movements disrupting sleep.  ROS  General: (-) fever, (-) chills, (-) night sweat Nose and Sinuses: (-) nasal stuffiness or itchiness, (-) postnasal drip, (-) nosebleeds, (-) sinus trouble. Mouth and Throat: (-) sore throat, (-) hoarseness. Neck: (-) swollen glands, (-) enlarged thyroid, (-) neck pain. Respiratory: - cough, + shortness of breath, - wheezing. Neurologic: - numbness, - tingling. Psychiatric: + anxiety, + depression   Current Medication: Outpatient Encounter Medications as of 04/03/2020  Medication Sig  . albuterol (VENTOLIN HFA) 108 (90 Base) MCG/ACT inhaler Inhale 2 puffs into the lungs every 6 (six) hours as needed for wheezing or shortness of breath.  . [DISCONTINUED] amphetamine-dextroamphetamine (ADDERALL) 20 MG tablet Take by mouth.  . [DISCONTINUED] benzonatate (TESSALON) 200 MG capsule Take one capsule by mouth three times a day as needed for cough  . [DISCONTINUED] chlorpheniramine-HYDROcodone (TUSSIONEX PENNKINETIC ER) 10-8 MG/5ML SUER Take 5 mLs by mouth at bedtime as needed for cough.  . [DISCONTINUED] escitalopram (LEXAPRO) 20 MG tablet   .  [DISCONTINUED] gabapentin (NEURONTIN) 100 MG capsule TK 1 TO 3 CS PO QD PRA  . [DISCONTINUED] guanFACINE (INTUNIV) 2 MG TB24 ER tablet TK 1 T PO QAM  . [DISCONTINUED] traZODone (DESYREL) 100 MG tablet Take by mouth.   No facility-administered encounter medications on file as of 04/03/2020.    Surgical History: Past Surgical History:  Procedure Laterality Date  . ADENOIDECTOMY      Medical History: Past Medical History:  Diagnosis Date  . ADHD   . Asthma   . Hemorrhoids   . Prehypertension     Family History: Non contributory to the present illness  Social History: Social History   Socioeconomic History  . Marital status: Single    Spouse name: Not on file  . Number of children: Not on file  . Years of education: Not on file  . Highest education level: Not on file  Occupational History  . Not on file  Tobacco Use  . Smoking status: Never Smoker  . Smokeless tobacco: Never Used  Vaping Use  . Vaping Use: Never used  Substance and Sexual Activity  . Alcohol use: No  . Drug use: No  . Sexual activity: Not on file  Other Topics Concern  . Not on file  Social History Narrative  . Not on file   Social Determinants of Health   Financial Resource Strain: Not on file  Food Insecurity: Not on file  Transportation Needs: Not on file  Physical Activity: Not on file  Stress: Not on file  Social Connections: Not on file  Intimate Partner Violence: Not on file    Vital Signs:  Blood pressure (!) 136/99, pulse 98, resp. rate 18, height 6' (1.829 m), weight (!) 320 lb (145.2 kg), SpO2 97 %.  Examination: General Appearance: The patient is well-developed, well-nourished, and in no distress. Neck Circumference: 49 cm Skin: Gross inspection of skin unremarkable. Head: normocephalic, no gross deformities. Eyes: no gross deformities noted. ENT: ears appear grossly normal Neurologic: Alert and oriented. No involuntary movements.    EPWORTH SLEEPINESS SCALE:  Scale:   (0)= no chance of dozing; (1)= slight chance of dozing; (2)= moderate chance of dozing; (3)= high chance of dozing  Chance  Situtation    Sitting and reading: 2    Watching TV: 2    Sitting Inactive in public: 1    As a passenger in car: 1      Lying down to rest: 3    Sitting and talking: 0    Sitting quielty after lunch: 2    In a car, stopped in traffic: 0   TOTAL SCORE:   11 out of 24    SLEEP STUDIES:  1. PSG 10/21/13 AHI 20 SpO78min 82%   CPAP COMPLIANCE DATA:  Date Range: 10/06/19-04/02/20  Average Daily Use: 8.7 hours  Median Use: 8.5  Compliance for > 4 Hours: 99%  AHI: 2.6 respiratory events per hour  Days Used: 180/180  Mask Leak: 1.5  95th Percentile Pressure: 12.4         LABS: No results found for this or any previous visit (from the past 2160 hour(s)).  Radiology: No results found.  No results found.  No results found.    Assessment and Plan: Patient Active Problem List   Diagnosis Date Noted  . Primary insomnia 06/25/2017  . OSA (obstructive sleep apnea) 11/03/2013  . Asthma 02/12/2012  . Hay fever 02/12/2012  . ATTENTION DEFICIT DISORDER, INATTENTIVE TYPE 10/06/2006  . ASTHMA 10/06/2006      The patient does tolerate PAP and reports significant benefit from PAP use. He feels that the pressure bis too high but he has gained 40 lbs since March. He was advised to elevate his upper body in bed to help minimize the pressure. He also complains of nasal congestions and he will need to increase his humidfier setting and get heated tubing. He reports sleepiness but just started teaching. The patient was reminded how to increase the humidifier setting . The patient was also counselled on the importance of weight loss. The compliance is excellent. The apnea is well controlled.   1. OSA- continue excellent compliance. 2. CPAP couseling-Discussed importance of adequate CPAP use as well as proper care and cleaning techniques of  machine and all supplies. 3. Morbid obesity-Stopped Adderall about 8 months ago, discussed weight gain could be from no longer taking Adderall and the appetite suppression component, discussed being aware of his calorie intake and encouraged a more active liefstyle 4. Asthma-Mostly well controlled, is recovering from acute upper respiratory illness and is having some shortness of breath requiring use of albuterol inhaler, managed by PCP 5. Elevated BP reading-discussed BP today, encouraged to monitor closely and contact PCP for elevated readings  General Counseling: I have discussed the findings of the evaluation and examination with Roger Bender.  I have also discussed any further diagnostic evaluation thatmay be needed or ordered today. Roger Bender verbalizes understanding of the findings of todays visit. We also reviewed his medications today and discussed drug interactions and side effects including but not limited excessive drowsiness and altered mental states. We also discussed that there is always  a risk not just to him but also people around him. he has been encouraged to call the office with any questions or concerns that should arise related to todays visit.  I have personally obtained a history, examined the patient, evaluated laboratory and imaging results, formulated the assessment and plan and placed orders.  This patient was seen by Roger Bender, AGNP-C in collaboration with Dr. Freda Munro as a part of collaborative care agreement.  Roger Hue Sol Blazing, PhD, FAASM  Diplomate, American Board of Sleep Medicine    Roger Pax, MD [facility] Diplomate ABMS Pulmonary and Critical Care Medicine Sleep medicine

## 2021-02-12 ENCOUNTER — Emergency Department: Payer: BC Managed Care – PPO

## 2021-02-12 ENCOUNTER — Emergency Department
Admission: EM | Admit: 2021-02-12 | Discharge: 2021-02-12 | Disposition: A | Discharge status: Home / Self Care | Payer: BC Managed Care – PPO | Attending: Emergency Medicine | Admitting: Emergency Medicine

## 2021-02-12 ENCOUNTER — Other Ambulatory Visit: Payer: Self-pay

## 2021-02-12 ENCOUNTER — Encounter: Payer: Self-pay | Admitting: Emergency Medicine

## 2021-02-12 DIAGNOSIS — J45909 Unspecified asthma, uncomplicated: Secondary | ICD-10-CM | POA: Insufficient documentation

## 2021-02-12 DIAGNOSIS — Z20822 Contact with and (suspected) exposure to covid-19: Secondary | ICD-10-CM | POA: Diagnosis not present

## 2021-02-12 DIAGNOSIS — R091 Pleurisy: Secondary | ICD-10-CM | POA: Insufficient documentation

## 2021-02-12 LAB — BASIC METABOLIC PANEL
Anion gap: 9 (ref 5–15)
BUN: 12 mg/dL (ref 6–20)
CO2: 24 mmol/L (ref 22–32)
Calcium: 8.9 mg/dL (ref 8.9–10.3)
Chloride: 103 mmol/L (ref 98–111)
Creatinine, Ser: 0.73 mg/dL (ref 0.61–1.24)
GFR, Estimated: >60 mL/min (ref >60)
Glucose, Bld: 90 mg/dL (ref 70–99)
Potassium: 4.1 mmol/L (ref 3.5–5.1)
Sodium: 136 mmol/L (ref 135–145)

## 2021-02-12 LAB — CBC
HCT: 47.5 % (ref 39.0–52.0)
Hemoglobin: 15.1 g/dL (ref 13.0–17.0)
MCH: 26.2 pg (ref 26.0–34.0)
MCHC: 31.8 g/dL (ref 30.0–36.0)
MCV: 82.3 fL (ref 80.0–100.0)
Platelets: 273 10*3/uL (ref 150–400)
RBC: 5.77 MIL/uL (ref 4.22–5.81)
RDW: 13.1 % (ref 11.5–15.5)
WBC: 9.3 10*3/uL (ref 4.0–10.5)
nRBC: 0 % (ref 0.0–0.2)

## 2021-02-12 LAB — D-DIMER, QUANTITATIVE: D-Dimer, Quant: 0.45 ug/mL-FEU (ref 0.00–0.50)

## 2021-02-12 LAB — RESP PANEL BY RT-PCR (FLU A&B, COVID) ARPGX2
Influenza A by PCR: NEGATIVE
Influenza B by PCR: NEGATIVE
SARS Coronavirus 2 by RT PCR: NEGATIVE

## 2021-02-12 LAB — TROPONIN I (HIGH SENSITIVITY)
Troponin I (High Sensitivity): 3 ng/L (ref <18)
Troponin I (High Sensitivity): 3 ng/L (ref <18)

## 2021-02-12 MED ORDER — IBUPROFEN 600 MG PO TABS
600.0000 mg | ORAL_TABLET | ORAL | Status: AC
  Administered 2021-02-12: 600 mg via ORAL
  Filled 2021-02-12: qty 1

## 2021-02-12 NOTE — ED Notes (Signed)
Pt seen walking to room from triage

## 2021-02-12 NOTE — ED Notes (Addendum)
Called lab and made aware [facility] sent for this pt for d-dimer

## 2021-02-12 NOTE — ED Provider Notes (Signed)
[facility] Emergency Department Provider Note   ____________________________________________   None    (approximate)  I have reviewed the triage vital signs and the nursing notes.   HISTORY  Chief Complaint Chest Pain and Shortness of Breath    HPI Roger Bender is a 30 y.o. male with a history of anxiety and asthma  Patient reports ports that at about 1 in the morning he started having a sharp pain over the right side of his chest.  He has had this before.  He reports he suffers from anxiety but he is having this sharp pain.  He does attribute that he had vaporized delta 8 the evening when this started.  He also advises that he is intending to no longer vape moving forward  No nausea or vomiting.  No fevers or chills.  No runny nose or cough.  He reports the pain is sharp it underneath his breastbone is worse when he takes a deep breath or coughs  No abdominal pain no nausea or vomiting.  Except he does note that for quite some time now he will occasionally get a pain just below his left lower rib cage from time to time, but this is not a new or acute issue but he has noticed that in the past as well  Triage note denotes that the patient was having a heavy pressure sensation in his chest, however he ever he reports to me that it is a sharp feeling in his chest and is below his breastbone.  Took an aspirin at home   Past Medical History:  Diagnosis Date   ADHD    Asthma    Hemorrhoids    Prehypertension     Patient Active Problem List   Diagnosis Date Noted   Primary insomnia 06/25/2017   OSA (obstructive sleep apnea) 11/03/2013   Asthma 02/12/2012   Hay fever 02/12/2012   ATTENTION DEFICIT DISORDER, INATTENTIVE TYPE 10/06/2006   ASTHMA 10/06/2006    Past Surgical History:  Procedure Laterality Date   ADENOIDECTOMY      Prior to Admission medications   Medication Sig Start Date End Date Taking? Authorizing Provider  albuterol  (VENTOLIN HFA) 108 (90 Base) MCG/ACT inhaler Inhale 2 puffs into the lungs every 6 (six) hours as needed for wheezing or shortness of breath. 09/04/19   Katy Apo, NP    Allergies Patient has no known allergies.  Family History  Problem Relation Age of Onset   Healthy Mother    Depression Father     Social History Social History   Tobacco Use   Smoking status: Never   Smokeless tobacco: Never  Vaping Use   Vaping Use: Never used  Substance Use Topics   Alcohol use: No   Drug use: No    Review of Systems Constitutional: No fever/chills ENT: No sore throat. Cardiovascular: Denies chest pain. Respiratory: Denies shortness of breath.  Does report pain under the breastbone especially with breathing somewhat with pushing on it and it is sharp in nature. Gastrointestinal: No abdominal pain except some chronic discomfort that will come along his left upper abdomen off-and-on.   Musculoskeletal: Negative for back pain. Skin: Negative for rash. Neurological: Negative for headaches, areas of focal weakness or numbness.    ____________________________________________   PHYSICAL EXAM:  VITAL SIGNS: ED Triage Vitals  Enc Vitals Group     BP 02/12/21 1457 (!) 147/117     Pulse Rate 02/12/21 1457 (!) 116  Resp 02/12/21 1457 20     Temp 02/12/21 1457 98.9 F (37.2 C)     Temp Source 02/12/21 1742 Oral     SpO2 02/12/21 1457 99 %     Weight 02/12/21 1456 (!) 310 lb (140.6 kg)     Height 02/12/21 1456 5\' 11"  (1.803 m)     Head Circumference --      Peak Flow --      Pain Score 02/12/21 1456 7     Pain Loc --      Pain Edu? --      Excl. in GC? --     Constitutional: Alert and oriented. Well appearing and in no acute distress. Eyes: Conjunctivae are normal. Head: Atraumatic. Nose: No congestion/rhinnorhea. Mouth/Throat: Mucous membranes are moist. Neck: No stridor.  Cardiovascular: Normal rate, regular rhythm. Grossly normal heart sounds.  Good peripheral  circulation.  Heart rate 90 during examination, occasionally rising to 105. Respiratory: Normal respiratory effort.  No retractions. Lungs CTAB.  Speaks in full clear sentences without distress.  He does report some tenderness to palpation across the anterior sternum when pressing upon it.  Also reports a element of pleuritic pain with deep respiration in the same area Gastrointestinal: Soft and nontender. No distention.  Negative Murphy. Musculoskeletal: No lower extremity tenderness nor edema. Neurologic:  Normal speech and language. No gross focal neurologic deficits are appreciated.  Skin:  Skin is warm, dry and intact. No rash noted. Psychiatric: Mood and affect are normal. Speech and behavior are normal.  ____________________________________________   LABS (all labs ordered are listed, but only abnormal results are displayed)  Labs Reviewed  RESP PANEL BY RT-PCR (FLU A&B, COVID) [id number]  BASIC METABOLIC PANEL  CBC  D-DIMER, QUANTITATIVE  TROPONIN I (HIGH SENSITIVITY)  TROPONIN I (HIGH SENSITIVITY)   ____________________________________________  EKG  Is reviewed inter by me at 1500 Heart rate 115 QRS 99 QTc 440 Sinus tachycardia.  Mild baseline artifact no evidence of acute ischemia ____________________________________________  RADIOLOGY  DG Chest 2 View  Result Date: 02/12/2021 CLINICAL DATA:  Chest pain. EXAM: CHEST - 2 VIEW COMPARISON:  April 13, 2018. FINDINGS: The heart size and mediastinal contours are within normal limits. Both lungs are clear. The visualized skeletal structures are unremarkable. IMPRESSION: No active cardiopulmonary disease. Electronically Signed   By: April 15, 2018 M.D.   On: 02/12/2021 15:19     Chest x-ray reviewed normal ____________________________________________   PROCEDURES  Procedure(s) performed: None  Procedures  Critical Care performed: No  ____________________________________________   INITIAL IMPRESSION / ASSESSMENT  AND PLAN / ED COURSE  Pertinent labs & imaging results that were available during my care of the patient were reviewed by me and considered in my medical decision making (see chart for details).   Differential diagnosis includes, but is not limited to, ACS, aortic dissection, pulmonary embolism, cardiac tamponade, pneumothorax, pneumonia, pericarditis, myocarditis, GI-related causes including esophagitis/gastritis, and musculoskeletal chest wall pain.    Clinical history of pleuritic pain etiology seems to be suggestive of some type of pleurisy.  May be exacerbated by the vaporizing delta 8.  No evidence of acute infectious etiology pneumothorax rib fracture or ACS.  Full troponin is normal, notably no major risk factors for ACS other than smoking status are noted.  Normal white count.  Pain seems quite reproducible as well suspect pleuritic possibly musculoskeletal or underlying pleurisy.  No signs or symptoms suggest acute dissection referred intra-abdominal etiology or other cause identified at this time.  However  given the patient's elevated heart rate with pleuritic pain, D-dimer also sent felt to be low pretest probability for PE and D-dimer exclusionary  Patient reports pain is actually improved after aspirin which she took at home    ----------------------------------------- 11:26 PM on 02/12/2021 -----------------------------------------  Patient reports all pain has resolved after ibuprofen.  Resting comfortably. Return precautions and treatment recommendations and follow-up discussed with the patient who is agreeable with the plan.  Did notes that he does not currently have a primary care physician and his primary left practice.  He would like to establish and provided information for follow-up with [facility] clinic  ____________________________________________   FINAL CLINICAL IMPRESSION(S) / ED DIAGNOSES  Final diagnoses:  Pleurisy        Note:  This document was prepared  using Dragon voice recognition software and may include unintentional dictation errors       Delman Kitten, MD 02/12/21 2327

## 2021-02-12 NOTE — ED Triage Notes (Signed)
Pt reports awoke around 0100 this am with pressure heavy like CP and SOB. Pt reports has been trying to relax it off but it has not gone away.

## 2021-03-29 IMAGING — CR LEFT FOOT - COMPLETE 3+ VIEW
3 series · 3 of 3 positions shown · non-contrast
Comparison: None.

CLINICAL DATA: Anterior ankle and dorsal foot pain after falling
down steps today. Initial encounter.

EXAM:
LEFT FOOT - COMPLETE 3+ VIEW

[foot ap]
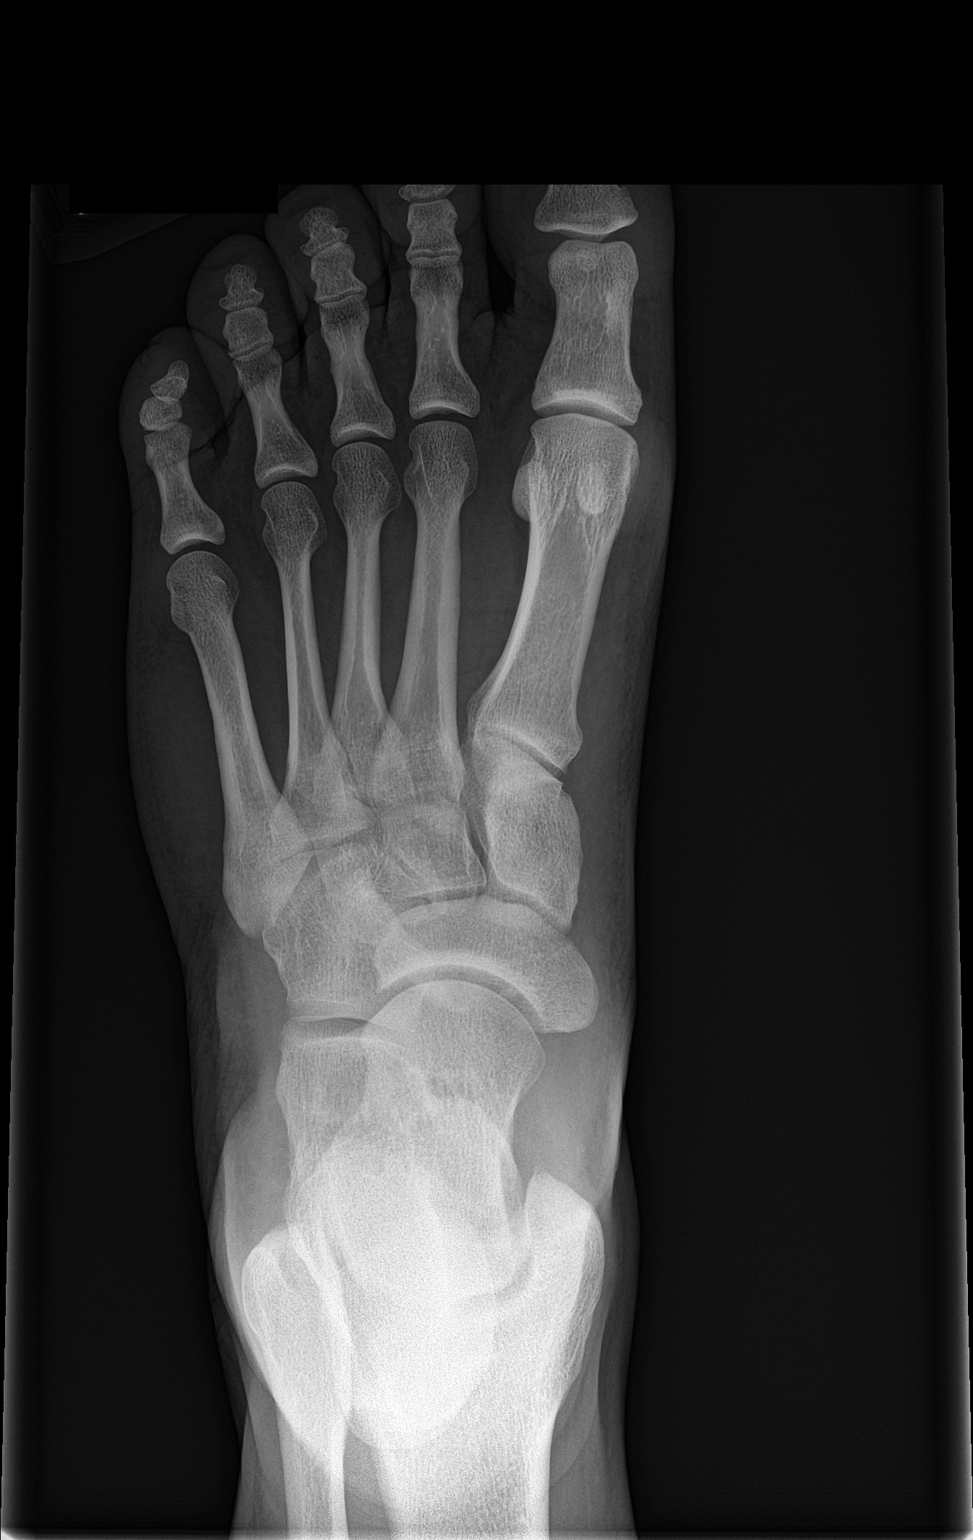

[foot obl]
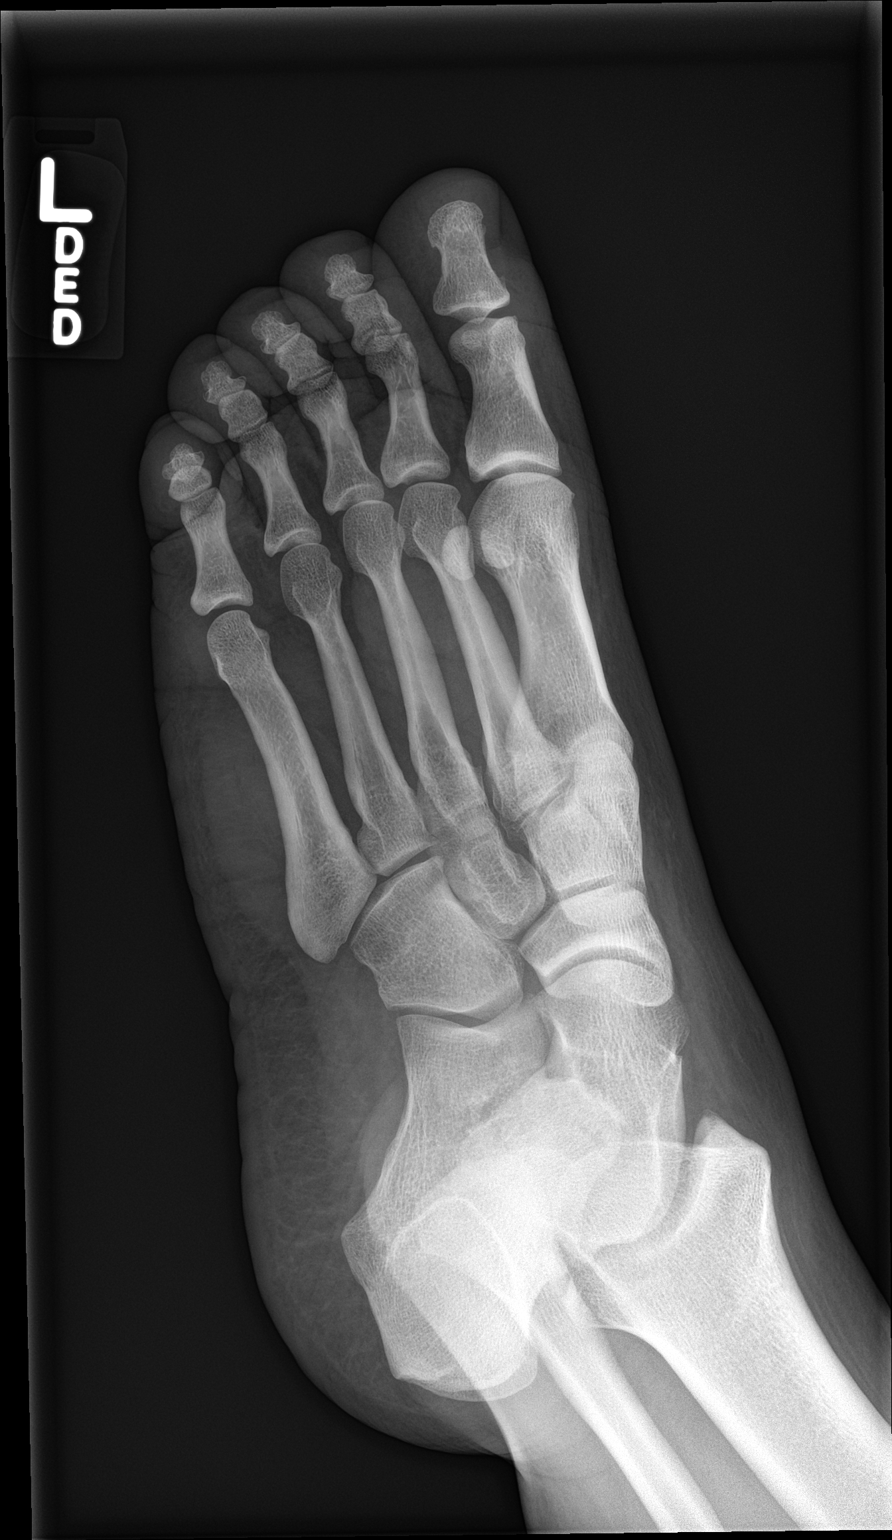

[foot lat]
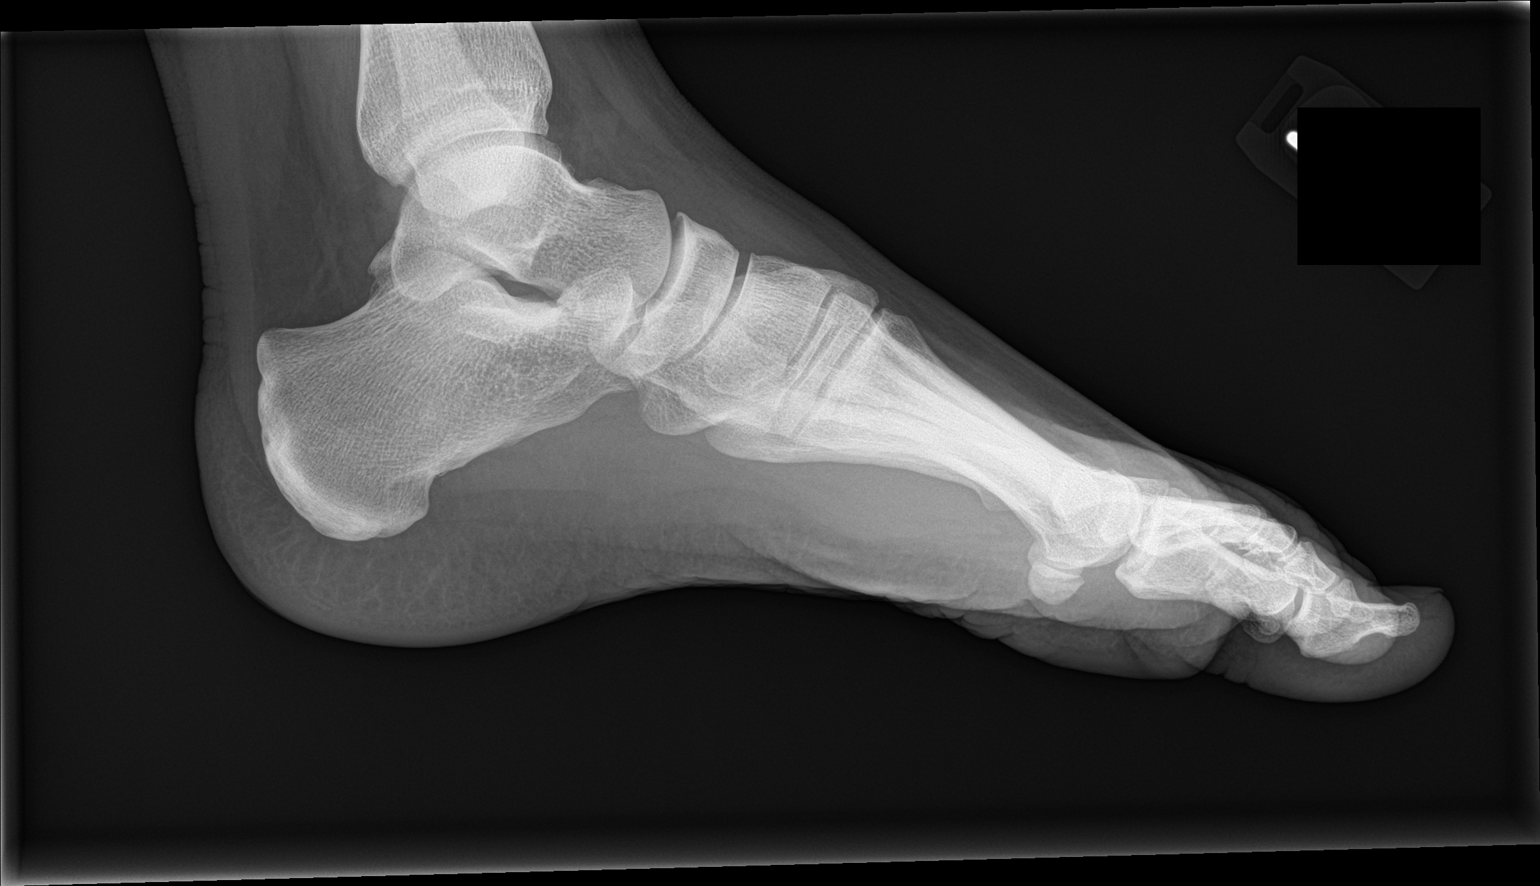

[3 of 3 positions shown; findings below may reference images not displayed]

FINDINGS: The mineralization and alignment are normal. There is no evidence of
acute fracture or dislocation. The joint spaces are preserved. No
focal soft tissue swelling identified.
IMPRESSION: No evidence of acute fracture or dislocation.

## 2021-03-30 NOTE — Progress Notes (Signed)
[facility] [address] [place], [place] [address]  Pulmonary Sleep Medicine   Office Visit Note  Patient Name: Roger Bender DOB: December 28, 1990 MRN [id number]    Chief Complaint: Obstructive Sleep Apnea visit  Brief History:  Roger Bender is seen today for annual follow up. The patient has a 2 year history of sleep apnea. Patient is using PAP nightly.  The patient feels initially groggy, then ok. after sleeping with PAP.  The patient reports symptoms resolved and he benefits from therapy. from PAP use.  Epworth Sleepiness Score is 8 out of 24. The patient does take naps with PAP on approx 1.5 - 2 hours on weekends or days off.. The patient complains of the following: water collecting in mask sometimes  The compliance download shows  compliance with an average use time of 8:55 hours @ 100%. The AHI is 2.5  The patient does not complain of limb movements disrupting sleep.  ROS  General: (-) fever, (-) chills, (-) night sweat Nose and Sinuses: (-) nasal stuffiness or itchiness, (-) postnasal drip, (-) nosebleeds, (-) sinus trouble. Mouth and Throat: (-) sore throat, (-) hoarseness. Neck: (-) swollen glands, (-) enlarged thyroid, (-) neck pain. Respiratory: - cough, + shortness of breath, + wheezing. Neurologic: - numbness, - tingling. Psychiatric: + anxiety, + depression   Current Medication: Outpatient Encounter Medications as of 04/02/2021  Medication Sig   albuterol (VENTOLIN HFA) 108 (90 Base) MCG/ACT inhaler Inhale 2 puffs into the lungs every 6 (six) hours as needed for wheezing or shortness of breath.   buPROPion (WELLBUTRIN XL) 150 MG 24 hr tablet Take 150 mg by mouth daily.   busPIRone (BUSPAR) 30 MG tablet Take 30 mg by mouth 2 (two) times daily.   gabapentin (NEURONTIN) 300 MG capsule Take 300 mg by mouth 3 (three) times daily.   omeprazole (PRILOSEC) 40 MG capsule Take 40 mg by mouth daily.   No facility-administered encounter medications on file as of 04/02/2021.     Surgical History: Past Surgical History:  Procedure Laterality Date   ADENOIDECTOMY      Medical History: Past Medical History:  Diagnosis Date   ADHD    Asthma    Hemorrhoids    Prehypertension     Family History: Non contributory to the present illness  Social History: Social History   Socioeconomic History   Marital status: Single    Spouse name: Not on file   Number of children: Not on file   Years of education: Not on file   Highest education level: Not on file  Occupational History   Not on file  Tobacco Use   Smoking status: Never   Smokeless tobacco: Never  Vaping Use   Vaping Use: Never used  Substance and Sexual Activity   Alcohol use: No   Drug use: No   Sexual activity: Not on file  Other Topics Concern   Not on file  Social History Narrative   Not on file   Social Determinants of Health   Financial Resource Strain: Not on file  Food Insecurity: Not on file  Transportation Needs: Not on file  Physical Activity: Not on file  Stress: Not on file  Social Connections: Not on file  Intimate Partner Violence: Not on file    Vital Signs: Blood pressure (!) 157/78, pulse 84, resp. rate 16, height 6' (1.829 m), weight (!) 310 lb (140.6 kg), SpO2 97 %. Body mass index is 42.04 kg/m.    Examination: General Appearance: The  patient is well-developed, well-nourished, and in no distress. Neck Circumference: 38 cm Skin: Gross inspection of skin unremarkable. Head: normocephalic, no gross deformities. Eyes: no gross deformities noted. ENT: ears appear grossly normal Neurologic: Alert and oriented. No involuntary movements.    EPWORTH SLEEPINESS SCALE:  Scale:  (0)= no chance of dozing; (1)= slight chance of dozing; (2)= moderate chance of dozing; (3)= high chance of dozing  Chance  Situtation    Sitting and reading: 2    Watching TV: 2    Sitting Inactive in public: 1    As a passenger in car: 1      Lying down to rest: 1     Sitting and talking: 0    Sitting quielty after lunch: 1    In a car, stopped in traffic: 0   TOTAL SCORE:   8 out of 24    SLEEP STUDIES:  Split 03-24-19 AHI 133 SpO642min 68%   CPAP COMPLIANCE DATA:  Date Range: 03/29/20 - 03/28/21  Average Daily Use: 8:55 hours  Median Use: 8:45 hours  Compliance for > 4 Hours: 100% days  AHI: 2.5 respiratory events per hour  Days Used: 365/365  Mask Leak: 3.8 lpm  95th Percentile Pressure: 12.2 cmH2O         LABS: Recent Results (from the past 2160 hour(s))  Basic metabolic panel     Status: None   Collection Time: 02/12/21  2:58 PM  Result Value Ref Range   Sodium 136 135 - 145 mmol/L   Potassium 4.1 3.5 - 5.1 mmol/L   Chloride 103 98 - 111 mmol/L   CO2 24 22 - 32 mmol/L   Glucose, Bld 90 70 - 99 mg/dL    Comment: Glucose reference range applies only to samples taken after fasting for at least 8 hours.   BUN 12 6 - 20 mg/dL   Creatinine, Ser 1.610.73 0.61 - 1.24 mg/dL   Calcium 8.9 8.9 - 09.610.3 mg/dL   GFR, Estimated >04>60 >54>60 mL/min    Comment: (NOTE) Calculated using the CKD-EPI Creatinine Equation (2021)    Anion gap 9 5 - 15    Comment: Performed at [facility], [address] [place], [place] [address]  CBC     Status: None   Collection Time: 02/12/21  2:58 PM  Result Value Ref Range   WBC 9.3 4.0 - 10.5 K/uL   RBC 5.77 4.22 - 5.81 MIL/uL   Hemoglobin 15.1 13.0 - 17.0 g/dL   HCT 91.447.5 78.239.0 - 95.652.0 %   MCV 82.3 80.0 - 100.0 fL   MCH 26.2 26.0 - 34.0 pg   MCHC 31.8 30.0 - 36.0 g/dL   RDW 21.313.1 08.611.5 - 57.815.5 %   Platelets 273 150 - 400 K/uL   nRBC 0.0 0.0 - 0.2 %    Comment: Performed at [facility], [address] [place], [place] [address]  Troponin I (High Sensitivity)     Status: None   Collection Time: 02/12/21  2:58 PM  Result Value Ref Range   Troponin I (High Sensitivity) 3 <18 ng/L    Comment: (NOTE) Elevated high sensitivity troponin I (hsTnI) values and significant  changes  across serial measurements may suggest ACS but many other  chronic and acute conditions are known to elevate hsTnI results.  Refer to the "Links" section for chest pain algorithms and additional  guidance. Performed at [facility], [address] [place], [place] [address]   Troponin I (High Sensitivity)  Status: None   Collection Time: 02/12/21  5:42 PM  Result Value Ref Range   Troponin I (High Sensitivity) 3 <18 ng/L    Comment: (NOTE) Elevated high sensitivity troponin I (hsTnI) values and significant  changes across serial measurements may suggest ACS but many other  chronic and acute conditions are known to elevate hsTnI results.  Refer to the "Links" section for chest pain algorithms and additional  guidance. Performed at [facility], [address] [place], [place] [address]   Resp Panel by RT-PCR (Flu A&B, Covid) Nasopharyngeal Swab     Status: None   Collection Time: 02/12/21  9:19 PM   Specimen: Nasopharyngeal Swab; Nasopharyngeal(NP) swabs in vial transport medium  Result Value Ref Range   SARS Coronavirus 2 by RT PCR NEGATIVE NEGATIVE    Comment: (NOTE) SARS-CoV-2 target nucleic acids are NOT DETECTED.  The SARS-CoV-2 RNA is generally detectable in upper respiratory specimens during the acute phase of infection. The lowest concentration of SARS-CoV-2 viral copies this assay can detect is 138 copies/mL. A negative result does not preclude SARS-Cov-2 infection and should not be used as the sole basis for treatment or other patient management decisions. A negative result may occur with  improper specimen collection/handling, submission of specimen other than nasopharyngeal swab, presence of viral mutation(s) within the areas targeted by this assay, and inadequate number of viral copies(<138 copies/mL). A negative result must be combined with clinical observations, patient history, and epidemiological information. The expected result is  Negative.  Fact Sheet for Patients:  BloggerCourse.com  Fact Sheet for Healthcare Providers:  SeriousBroker.it  This test is no t yet approved or cleared by the [place] FDA and  has been authorized for detection and/or diagnosis of SARS-CoV-2 by FDA under an Emergency Use Authorization (EUA). This EUA will remain  in effect (meaning this test can be used) for the duration of the COVID-19 declaration under Section 564(b)(1) of the Act, 21 U.S.C.section 360bbb-3(b)(1), unless the authorization is terminated  or revoked sooner.       Influenza A by PCR NEGATIVE NEGATIVE   Influenza B by PCR NEGATIVE NEGATIVE    Comment: (NOTE) The Xpert Xpress SARS-CoV-2/FLU/RSV plus assay is intended as an aid in the diagnosis of influenza from Nasopharyngeal swab specimens and should not be used as a sole basis for treatment. Nasal washings and aspirates are unacceptable for Xpert Xpress SARS-CoV-2/FLU/RSV testing.  Fact Sheet for Patients: BloggerCourse.com  Fact Sheet for Healthcare Providers: SeriousBroker.it  This test is not yet approved or cleared by the [place] FDA and has been authorized for detection and/or diagnosis of SARS-CoV-2 by FDA under an Emergency Use Authorization (EUA). This EUA will remain in effect (meaning this test can be used) for the duration of the COVID-19 declaration under Section 564(b)(1) of the Act, 21 U.S.C. section 360bbb-3(b)(1), unless the authorization is terminated or revoked.  Performed at [facility], [address] [place], [place] [address]   D-dimer, quantitative     Status: None   Collection Time: 02/12/21 10:34 PM  Result Value Ref Range   D-Dimer, Quant 0.45 0.00 - 0.50 ug/mL-FEU    Comment: (NOTE) At the manufacturer cut-off value of 0.5 g/mL FEU, this assay has a negative predictive value of 95-100%.This assay is  intended for use in conjunction with a clinical pretest probability (PTP) assessment model to exclude pulmonary embolism (PE) and deep venous thrombosis (DVT) in outpatients suspected of PE or DVT. Results should be correlated with clinical  presentation. Performed at [facility], [address] [place], [place] [address]     Radiology: DG Chest 2 View  Result Date: 02/12/2021 CLINICAL DATA:  Chest pain. EXAM: CHEST - 2 VIEW COMPARISON:  April 13, 2018. FINDINGS: The heart size and mediastinal contours are within normal limits. Both lungs are clear. The visualized skeletal structures are unremarkable. IMPRESSION: No active cardiopulmonary disease. Electronically Signed   By: Lupita RaiderJames  Green Jr M.D.   On: 02/12/2021 15:19    No results found.  No results found.    Assessment and Plan: Patient Active Problem List   Diagnosis Date Noted   OSA on CPAP 04/02/2021   CPAP use counseling 04/02/2021   Morbid obesity (HCC) 04/02/2021   Hypertension 04/02/2021   Anxiety 04/02/2021   Primary insomnia 06/25/2017   Insomnia, transient 09/08/2015   OSA (obstructive sleep apnea) 11/03/2013   Asthma 02/12/2012   Hay fever 02/12/2012   ADHD (attention deficit hyperactivity disorder), combined type 02/12/2012   ATTENTION DEFICIT DISORDER, INATTENTIVE TYPE 10/06/2006   ASTHMA 10/06/2006    1. OSA on CPAP The patient does tolerate PAP and reports  benefit from PAP use. The patient was reminded how to clean equipment and advised to replace supplies routinely. The patient was also counselled on weight loss. The compliance is excellent. The AHI is 2.5 .   OSA- continue with excellent compliance with pap. F/u one year.    2. CPAP use counseling CPAP Counseling: had a lengthy discussion with the patient regarding the importance of PAP therapy in management of the sleep apnea. Patient appears to understand the risk factor reduction and also understands the risks associated with  untreated sleep apnea. Patient will try to make a good faith effort to remain compliant with therapy. Also instructed the patient on proper cleaning of the device including the water must be changed daily if possible and use of distilled water is preferred. Patient understands that the machine should be regularly cleaned with appropriate recommended cleaning solutions that do not damage the PAP machine for example given white vinegar and water rinses. Other methods such as ozone treatment may not be as good as these simple methods to achieve cleaning.   3. Morbid obesity (HCC) Obesity Counseling: Had a lengthy discussion regarding patients BMI and weight issues. Patient was instructed on portion control as well as increased activity. Also discussed caloric restrictions with trying to maintain intake less than 2000 Kcal. Discussions were made in accordance with the 5As of weight management. Simple actions such as not eating late and if able to, taking a walk is suggested.   4. Hypertension, unspecified type Pt needs to establish with a pcp for HTN treatment. His prior provider is not available. He was started on medication and it ran out. He is advised how to locate a provider.  Hypertension Counseling:   The following hypertensive lifestyle modification were recommended and discussed:  1. Limiting alcohol intake to less than 1 oz/day of ethanol:(24 oz of beer or 8 oz of wine or 2 oz of 100-proof whiskey). 2. Take baby ASA 81 mg daily. 3. Importance of regular aerobic exercise and losing weight. 4. Reduce dietary saturated fat and cholesterol intake for overall cardiovascular health. 5. Maintaining adequate dietary potassium, calcium, and magnesium intake. 6. Regular monitoring of the blood pressure. 7. Reduce sodium intake to less than 100 mmol/day (less than 2.3 gm of sodium or less than 6 gm of sodium choride)    5. Anxiety Continue f/u and  medication from psychiatrist.    General Counseling: I  have discussed the findings of the evaluation and examination with Roberts.  I have also discussed any further diagnostic evaluation thatmay be needed or ordered today. Welcome verbalizes understanding of the findings of todays visit. We also reviewed his medications today and discussed drug interactions and side effects including but not limited excessive drowsiness and altered mental states. We also discussed that there is always a risk not just to him but also people around him. he has been encouraged to call the office with any questions or concerns that should arise related to todays visit.  No orders of the defined types were placed in this encounter.       I have personally obtained a history, examined the patient, evaluated laboratory and imaging results, formulated the assessment and plan and placed orders. This patient was seen today by Emmaline Kluver, PA-C in collaboration with Dr. Freda Munro.   Yevonne Pax, MD [facility] Diplomate ABMS Pulmonary Critical Care Medicine and Sleep Medicine

## 2021-04-02 ENCOUNTER — Ambulatory Visit (INDEPENDENT_AMBULATORY_CARE_PROVIDER_SITE_OTHER): Payer: BC Managed Care – PPO | Admitting: Internal Medicine

## 2021-04-02 VITALS — BP 157/78 | HR 84 | Resp 16 | Ht 72.0 in | Wt 310.0 lb

## 2021-04-02 DIAGNOSIS — I10 Essential (primary) hypertension: Secondary | ICD-10-CM

## 2021-04-02 DIAGNOSIS — G4733 Obstructive sleep apnea (adult) (pediatric): Secondary | ICD-10-CM | POA: Diagnosis not present

## 2021-04-02 DIAGNOSIS — Z9989 Dependence on other enabling machines and devices: Secondary | ICD-10-CM

## 2021-04-02 DIAGNOSIS — Z7189 Other specified counseling: Secondary | ICD-10-CM | POA: Diagnosis not present

## 2021-04-02 DIAGNOSIS — F419 Anxiety disorder, unspecified: Secondary | ICD-10-CM | POA: Insufficient documentation

## 2021-04-02 NOTE — Patient Instructions (Signed)

## 2021-05-28 ENCOUNTER — Telehealth: Payer: BC Managed Care – PPO | Admitting: Physician Assistant

## 2021-05-28 ENCOUNTER — Ambulatory Visit: Payer: Self-pay | Admitting: *Deleted

## 2021-05-28 DIAGNOSIS — U071 COVID-19: Secondary | ICD-10-CM | POA: Diagnosis not present

## 2021-05-28 MED ORDER — IPRATROPIUM-ALBUTEROL 0.5-2.5 (3) MG/3ML IN SOLN: 3.0000 mL | Freq: Four times a day (QID) | RESPIRATORY_TRACT | 0 refills | Status: DC | PRN

## 2021-05-28 MED ORDER — FLUTICASONE PROPIONATE 50 MCG/ACT NA SUSP: 2.0000 | Freq: Every day | NASAL | 0 refills | Status: DC

## 2021-05-28 MED ORDER — ALBUTEROL SULFATE HFA 108 (90 BASE) MCG/ACT IN AERS
2.0000 | INHALATION_SPRAY | Freq: Four times a day (QID) | RESPIRATORY_TRACT | 0 refills | Status: DC | PRN
Start: 2021-05-28 — End: 2022-03-14

## 2021-05-28 NOTE — Progress Notes (Signed)
?Virtual Visit Consent  ? ?Cristy Friedlander, you are scheduled for a virtual visit with a [facility] provider today.   ?  ?Just as with appointments in the office, your consent must be obtained to participate.  Your consent will be active for this visit and any virtual visit you may have with one of our providers in the next 365 days.   ?  ?If you have a MyChart account, a copy of this consent can be sent to you electronically.  All virtual visits are billed to your insurance company just like a traditional visit in the office.   ? ?As this is a virtual visit, video technology does not allow for your provider to perform a traditional examination.  This may limit your provider's ability to fully assess your condition.  If your provider identifies any concerns that need to be evaluated in person or the need to arrange testing (such as labs, EKG, etc.), we will make arrangements to do so.   ?  ?Although advances in technology are sophisticated, we cannot ensure that it will always work on either your end or our end.  If the connection with a video visit is poor, the visit may have to be switched to a telephone visit.  With either a video or telephone visit, we are not always able to ensure that we have a secure connection.    ? ?I need to obtain your verbal consent now.   Are you willing to proceed with your visit today?  ?  ?Roger Bender has provided verbal consent on 05/28/2021 for a virtual visit (video or telephone). ?  ?Mar Daring, PA-C  ? ?Date: 05/28/2021 9:38 AM ? ? ?Virtual Visit via Video Note  ? ?Roger Bender, connected with  Roger Bender  ([id number], [birth year]) on 05/28/21 at  9:15 AM EST by a video-enabled telemedicine application and verified that I am speaking with the correct person using two identifiers. ? ?Location: ?Patient: Virtual Visit Location Patient: Home ?Provider: Virtual Visit Location Provider: Home Office ?  ?I discussed the limitations of evaluation and management by  telemedicine and the availability of in person appointments. The patient expressed understanding and agreed to proceed.   ? ?History of Present Illness: ?Roger Bender is a 31 y.o. who identifies as a male who was assigned male at birth, and is being seen today for Covid 43. ? ?HPI: URI  ?This is a new problem. Episode onset: tested positive for Covid 19 today; symptoms started yesterday. The problem has been gradually worsening. Maximum temperature: subjective fever. The fever has been present for 1 to 2 days. Associated symptoms include congestion, coughing, headaches, a plugged ear sensation, rhinorrhea and sinus pain. Pertinent negatives include no diarrhea, ear pain, nausea, sore throat, vomiting or wheezing. Associated symptoms comments: Myalgias, fatigue, chills, mild SOB. He has tried acetaminophen and NSAIDs for the symptoms. The treatment provided no relief.   ?2 at home covid test were positive ? ?Problems:  ?Patient Active Problem List  ? Diagnosis Date Noted  ? OSA on CPAP 04/02/2021  ? CPAP use counseling 04/02/2021  ? Morbid obesity ([place]) 04/02/2021  ? Hypertension 04/02/2021  ? Anxiety 04/02/2021  ? Primary insomnia 06/25/2017  ? Insomnia, transient 09/08/2015  ? OSA (obstructive sleep apnea) 11/03/2013  ? Asthma 02/12/2012  ? Hay fever 02/12/2012  ? 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 ATTENTION DEFICIT DISORDER, INATTENTIVE TYPE 10/06/2006  ? ASTHMA 10/06/2006  ?  ?  Allergies:  ?Allergies  ?Allergen Reactions  ? Cat Hair Extract Shortness Of Breath  ? ?Medications:  ?Current Outpatient Medications:  ?  albuterol (VENTOLIN HFA) 108 (90 Base) MCG/ACT inhaler, Inhale 2 puffs into the lungs every 6 (six) hours as needed for wheezing or shortness of breath., Disp: 18 g, Rfl: 0 ?  fluticasone (FLONASE) 50 MCG/ACT nasal spray, Place 2 sprays into both nostrils daily., Disp: 16 g, Rfl: 0 ?  ipratropium-albuterol (DUONEB) 0.5-2.5 (3) MG/3ML SOLN, Take 3 mLs by  nebulization every 6 (six) hours as needed., Disp: 360 mL, Rfl: 0 ?  buPROPion (WELLBUTRIN XL) 150 MG 24 hr tablet, Take 150 mg by mouth daily., Disp: , Rfl:  ?  busPIRone (BUSPAR) 30 MG tablet, Take 30 mg by mouth 2 (two) times daily., Disp: , Rfl:  ?  gabapentin (NEURONTIN) 300 MG capsule, Take 300 mg by mouth 3 (three) times daily., Disp: , Rfl:  ?  omeprazole (PRILOSEC) 40 MG capsule, Take 40 mg by mouth daily., Disp: , Rfl:  ? ?Observations/Objective: ?Patient is well-developed, well-nourished in no acute distress.  ?Resting comfortably at home.  ?Head is normocephalic, atraumatic.  ?No labored breathing.  ?Speech is clear and coherent with logical content.  ?Patient is alert and oriented at baseline.  ? ? ?Assessment and Plan: ?1. COVID-19 ?- albuterol (VENTOLIN HFA) 108 (90 Base) MCG/ACT inhaler; Inhale 2 puffs into the lungs every 6 (six) hours as needed for wheezing or shortness of breath.  Dispense: 18 g; Refill: 0 ?- fluticasone (FLONASE) 50 MCG/ACT nasal spray; Place 2 sprays into both nostrils daily.  Dispense: 16 g; Refill: 0 ?- ipratropium-albuterol (DUONEB) 0.5-2.5 (3) MG/3ML SOLN; Take 3 mLs by nebulization every 6 (six) hours as needed.  Dispense: 360 mL; Refill: 0 ? ?- Continue OTC symptomatic management of choice ?- Will send OTC vitamins and supplement information through [facility] ?- Albuterol, duoneb, and flonase prescribed ?- Patient enrolled in MyChart symptom monitoring ?- Push fluids ?- Rest as needed ?- Discussed return precautions and when to seek in-person evaluation, sent via [facility] as well ? ? ?Follow Up Instructions: ?I discussed the assessment and treatment plan with the patient. The patient was provided an opportunity to ask questions and all were answered. The patient agreed with the plan and demonstrated an understanding of the instructions.  A copy of instructions were sent to the patient via MyChart unless otherwise noted below.  ? ?The patient was advised to call back or seek an  in-person evaluation if the symptoms worsen or if the condition fails to improve as anticipated. ? ?Time:  ?I spent 23 minutes with the patient via telehealth technology discussing the above problems/concerns.   ? ?Mar Daring, PA-C ?

## 2021-05-28 NOTE — Telephone Encounter (Signed)
?  Chief Complaint: covid ?Symptoms: fever chills ?Frequency: freq ?Pertinent Negatives: Patient denies sob except mild ?Disposition: [] ED /[] Urgent Care (no appt availability in office) / [] Appointment(In office/virtual)/ [x]  [facility]/ [] Home Care/ [] Refused Recommended Disposition /[] [facility] [place] [facility]/ []  Follow-up with PCP ?Additional Notes: No PCP, virtual urgent care through [facility] appt made. ? ? ?Reason for Disposition ? [1] COVID-19 infection suspected by caller or triager AND [2] mild symptoms (cough, fever, or others) AND [3] negative COVID-19 rapid test ? ?Answer Assessment - Initial Assessment Questions ?1. COVID-19 DIAGNOSIS: "Who made your COVID-19 diagnosis?" "Was it confirmed by a positive lab test or self-test?" If not diagnosed by a doctor (or NP/PA), ask "Are there lots of cases (community spread) where you live?" Note: See public health department website, if unsure. ?    Home test x two positive ?2. COVID-19 EXPOSURE: "Was there any known exposure to COVID before the symptoms began?" CDC Definition of close contact: within 6 feet (2 meters) for a total of 15 minutes or more over a 24-hour period.  ?   Unknown but he is a probably Thurs or Fri and went to funeral Saturday ?3. ONSET: "When did the COVID-19 symptoms start?"  ?    Yesterday during the early morning ?4. WORST SYMPTOM: "What is your worst symptom?" (e.g., cough, fever, shortness of breath, muscle aches) ?    Chills and body aches ?5. COUGH: "Do you have a cough?" If Yes, ask: "How bad is the cough?"   ?    A little ?6. FEVER: "Do you have a fever?" If Yes, ask: "What is your temperature, how was it measured, and when did it start?" ?    Feel like fever, no thermometer ?7. RESPIRATORY STATUS: "Describe your breathing?" (e.g., shortness of breath, wheezing, unable to speak)  ?    A little sob ?8. BETTER-SAME-WORSE: "Are you getting better, staying the same or getting worse compared to yesterday?"  If  getting worse, ask, "In what way?" ?    better ?9. HIGH RISK DISEASE: "Do you have any chronic medical problems?" (e.g., asthma, heart or lung disease, weak immune system, obesity, etc.) ?    ashthma ?10. VACCINE: "Have you had the COVID-19 vaccine?" If Yes, ask: "Which one, how many shots, when did you get it?" ?      First ones, not booster ?11. BOOSTER: "Have you received your COVID-19 booster?" If Yes, ask: "Which one and when did you get it?" ?      Had one booster, not the last one ?12. PREGNANCY: "Is there any chance you are pregnant?" "When was your last menstrual period?" ?      na ?13. OTHER SYMPTOMS: "Do you have any other symptoms?"  (e.g., chills, fatigue, headache, loss of smell or taste, muscle pain, sore throat) ?      Chills fatique, headache ?14. O2 SATURATION MONITOR:  "Do you use an oxygen saturation monitor (pulse oximeter) at home?" If Yes, ask "What is your reading (oxygen level) today?" "What is your usual oxygen saturation reading?" (e.g., 95%) ?      no ? ?Protocols used: Coronavirus (COVID-19) Diagnosed or Suspected-A-AH ? ?

## 2021-05-28 NOTE — Patient Instructions (Signed)
Roger Bender, thank you for joining Mar Daring, PA-C for today's virtual visit.  While this provider is not your primary care provider (PCP), if your PCP is located in our provider database this encounter information will be shared with them immediately following your visit.  Consent: (Patient) Roger Bender provided verbal consent for this virtual visit at the beginning of the encounter.  Current Medications:  Current Outpatient Medications:    albuterol (VENTOLIN HFA) 108 (90 Base) MCG/ACT inhaler, Inhale 2 puffs into the lungs every 6 (six) hours as needed for wheezing or shortness of breath., Disp: 18 g, Rfl: 0   fluticasone (FLONASE) 50 MCG/ACT nasal spray, Place 2 sprays into both nostrils daily., Disp: 16 g, Rfl: 0   ipratropium-albuterol (DUONEB) 0.5-2.5 (3) MG/3ML SOLN, Take 3 mLs by nebulization every 6 (six) hours as needed., Disp: 360 mL, Rfl: 0   buPROPion (WELLBUTRIN XL) 150 MG 24 hr tablet, Take 150 mg by mouth daily., Disp: , Rfl:    busPIRone (BUSPAR) 30 MG tablet, Take 30 mg by mouth 2 (two) times daily., Disp: , Rfl:    gabapentin (NEURONTIN) 300 MG capsule, Take 300 mg by mouth 3 (three) times daily., Disp: , Rfl:    omeprazole (PRILOSEC) 40 MG capsule, Take 40 mg by mouth daily., Disp: , Rfl:    Medications ordered in this encounter:  Meds ordered this encounter  Medications   albuterol (VENTOLIN HFA) 108 (90 Base) MCG/ACT inhaler    Sig: Inhale 2 puffs into the lungs every 6 (six) hours as needed for wheezing or shortness of breath.    Dispense:  18 g    Refill:  0    Order Specific Question:   Supervising Provider    Answer:   MILLER, BRIAN [[id number]]   fluticasone (FLONASE) 50 MCG/ACT nasal spray    Sig: Place 2 sprays into both nostrils daily.    Dispense:  16 g    Refill:  0    Order Specific Question:   Supervising Provider    Answer:   MILLER, BRIAN [[id number]]   ipratropium-albuterol (DUONEB) 0.5-2.5 (3) MG/3ML SOLN    Sig: Take 3 mLs by nebulization  every 6 (six) hours as needed.    Dispense:  360 mL    Refill:  0    Order Specific Question:   Supervising Provider    Answer:   Sabra Heck, BRIAN [[id number]]     *If you need refills on other medications prior to your next appointment, please contact your pharmacy*  Follow-Up: Call back or seek an in-person evaluation if the symptoms worsen or if the condition fails to improve as anticipated.  Other Instructions Can take to lessen severity: Vit C 500mg  twice daily Quercertin 250-500mg  twice daily Zinc 75-100mg  daily Melatonin 3-6 mg at bedtime Vit D3 1000-2000 IU daily Aspirin 81 mg daily with food Optional: Famotidine 20mg  daily Also can add tylenol/ibuprofen as needed for fevers and body aches May add Mucinex or Mucinex DM as needed for cough/congestion  COVID-19: Quarantine and Isolation Quarantine If you were exposed Quarantine and stay away from others when you have been in close contact with someone who has COVID-19. Isolate If you are sick or test positive Isolate when you are sick or when you have COVID-19, even if you don't have symptoms. When to stay home Calculating quarantine The date of your exposure is considered day 0. Day 1 is the first full day after your last contact with a person who has had  COVID-19. Stay home and away from other people for at least 5 days. Learn why CDC updated guidance for the general public. IF YOU were exposed to COVID-19 and are NOT  up to dateIF YOU were exposed to COVID-19 and are NOT on COVID-19 vaccinations Quarantine for at least 5 days Stay home Stay home and quarantine for at least 5 full days. Wear a well-fitting mask if you must be around others in your home. Do not travel. Get tested Even if you don't develop symptoms, get tested at least 5 days after you last had close contact with someone with COVID-19. After quarantine Watch for symptoms Watch for symptoms until 10 days after you last had close contact with someone with  COVID-19. Avoid travel It is best to avoid travel until a full 10 days after you last had close contact with someone with COVID-19. If you develop symptoms Isolate immediately and get tested. Continue to stay home until you know the results. Wear a well-fitting mask around others. Take precautions until day 10 Wear a well-fitting mask Wear a well-fitting mask for 10 full days any time you are around others inside your home or in public. Do not go to places where you are unable to wear a well-fitting mask. If you must travel during days 6-10, take precautions. Avoid being around people who are more likely to get very sick from COVID-19. IF YOU were exposed to COVID-19 and are  up to dateIF YOU were exposed to COVID-19 and are on COVID-19 vaccinations No quarantine You do not need to stay home unless you develop symptoms. Get tested Even if you don't develop symptoms, get tested at least 5 days after you last had close contact with someone with COVID-19. Watch for symptoms Watch for symptoms until 10 days after you last had close contact with someone with COVID-19. If you develop symptoms Isolate immediately and get tested. Continue to stay home until you know the results. Wear a well-fitting mask around others. Take precautions until day 10 Wear a well-fitting mask Wear a well-fitting mask for 10 full days any time you are around others inside your home or in public. Do not go to places where you are unable to wear a well-fitting mask. Take precautions if traveling Avoid being around people who are more likely to get very sick from COVID-19. IF YOU were exposed to COVID-19 and had confirmed COVID-19 within the past 90 days (you tested positive using a viral test) No quarantine You do not need to stay home unless you develop symptoms. Watch for symptoms Watch for symptoms until 10 days after you last had close contact with someone with COVID-19. If you develop symptoms Isolate  immediately and get tested. Continue to stay home until you know the results. Wear a well-fitting mask around others. Take precautions until day 10 Wear a well-fitting mask Wear a well-fitting mask for 10 full days any time you are around others inside your home or in public. Do not go to places where you are unable to wear a well-fitting mask. Take precautions if traveling Avoid being around people who are more likely to get very sick from COVID-19. Calculating isolation Day 0 is your first day of symptoms or a positive viral test. Day 1 is the first full day after your symptoms developed or your test specimen was collected. If you have COVID-19 or have symptoms, isolate for at least 5 days. IF YOU tested positive for COVID-19 or have symptoms, regardless of vaccination status  Stay home for at least 5 days Stay home for 5 days and isolate from others in your home. Wear a well-fitting mask if you must be around others in your home. Do not travel. Ending isolation if you had symptoms End isolation after 5 full days if you are fever-free for 24 hours (without the use of fever-reducing medication) and your symptoms are improving. Ending isolation if you did NOT have symptoms End isolation after at least 5 full days after your positive test. If you got very sick from COVID-19 or have a weakened immune system You should isolate for at least 10 days. Consult your doctor before ending isolation. Take precautions until day 10 Wear a well-fitting mask Wear a well-fitting mask for 10 full days any time you are around others inside your home or in public. Do not go to places where you are unable to wear a well-fitting mask. Do not travel Do not travel until a full 10 days after your symptoms started or the date your positive test was taken if you had no symptoms. Avoid being around people who are more likely to get very sick from COVID-19. Definitions Exposure Contact with someone infected with  SARS-CoV-2, the virus that causes COVID-19, in a way that increases the likelihood of getting infected with the virus. Close contact A close contact is someone who was less than 6 feet away from an infected person (laboratory-confirmed or a clinical diagnosis) for a cumulative total of 15 minutes or more over a 24-hour period. For example, three individual 5-minute exposures for a total of 15 minutes. People who are exposed to someone with COVID-19 after they completed at least 5 days of isolation are not considered close contacts. Julio Sicks is a strategy used to prevent transmission of COVID-19 by keeping people who have been in close contact with someone with COVID-19 apart from others. Who does not need to quarantine? If you had close contact with someone with COVID-19 and you are in one of the following groups, you do not need to quarantine. You are up to date with your COVID-19 vaccines. You had confirmed COVID-19 within the last 90 days (meaning you tested positive using a viral test). If you are up to date with COVID-19 vaccines, you should wear a well-fitting mask around others for 10 days from the date of your last close contact with someone with COVID-19 (the date of last close contact is considered day 0). Get tested at least 5 days after you last had close contact with someone with COVID-19. If you test positive or develop COVID-19 symptoms, isolate from other people and follow recommendations in the Isolation section below. If you tested positive for COVID-19 with a viral test within the previous 90 days and subsequently recovered and remain without COVID-19 symptoms, you do not need to quarantine or get tested after close contact. You should wear a well-fitting mask around others for 10 days from the date of your last close contact with someone with COVID-19 (the date of last close contact is considered day 0). If you have COVID-19 symptoms, get tested and isolate from other people  and follow recommendations in the Isolation section below. Who should quarantine? If you come into close contact with someone with COVID-19, you should quarantine if you are not up to date on COVID-19 vaccines. This includes people who are not vaccinated. What to do for quarantine Stay home and away from other people for at least 5 days (day 0 through day 5) after  your last contact with a person who has COVID-19. The date of your exposure is considered day 0. Wear a well-fitting mask when around others at home, if possible. For 10 days after your last close contact with someone with COVID-19, watch for fever (100.66F or greater), cough, shortness of breath, or other COVID-19 symptoms. If you develop symptoms, get tested immediately and isolate until you receive your test results. If you test positive, follow isolation recommendations. If you do not develop symptoms, get tested at least 5 days after you last had close contact with someone with COVID-19. If you test negative, you can leave your home, but continue to wear a well-fitting mask when around others at home and in public until 10 days after your last close contact with someone with COVID-19. If you test positive, you should isolate for at least 5 days from the date of your positive test (if you do not have symptoms). If you do develop COVID-19 symptoms, isolate for at least 5 days from the date your symptoms began (the date the symptoms started is day 0). Follow recommendations in the isolation section below. If you are unable to get a test 5 days after last close contact with someone with COVID-19, you can leave your home after day 5 if you have been without COVID-19 symptoms throughout the 5-day period. Wear a well-fitting mask for 10 days after your date of last close contact when around others at home and in public. Avoid people who are have weakened immune systems or are more likely to get very sick from COVID-19, and nursing homes and other  high-risk settings, until after at least 10 days. If possible, stay away from people you live with, especially people who are at higher risk for getting very sick from COVID-19, as well as others outside your home throughout the full 10 days after your last close contact with someone with COVID-19. If you are unable to quarantine, you should wear a well-fitting mask for 10 days when around others at home and in public. If you are unable to wear a mask when around others, you should continue to quarantine for 10 days. Avoid people who have weakened immune systems or are more likely to get very sick from COVID-19, and nursing homes and other high-risk settings, until after at least 10 days. See additional information about travel. Do not go to places where you are unable to wear a mask, such as restaurants and some gyms, and avoid eating around others at home and at work until after 10 days after your last close contact with someone with COVID-19. After quarantine Watch for symptoms until 10 days after your last close contact with someone with COVID-19. If you have symptoms, isolate immediately and get tested. Quarantine in high-risk congregate settings In certain congregate settings that have high risk of secondary transmission (such as Patent examiner and detention facilities, homeless shelters, or cruise ships), CDC recommends a 10-day quarantine for residents, regardless of vaccination and booster status. During periods of critical staffing shortages, facilities may consider shortening the quarantine period for staff to ensure continuity of operations. Decisions to shorten quarantine in these settings should be made in consultation with state, local, tribal, or territorial health departments and should take into consideration the context and characteristics of the facility. CDC's setting-specific guidance provides additional recommendations for these settings. Isolation Isolation is used to separate  people with confirmed or suspected COVID-19 from those without COVID-19. People who are in isolation should stay home until it's  safe for them to be around others. At home, anyone sick or infected should separate from others, or wear a well-fitting mask when they need to be around others. People in isolation should stay in a specific "sick room" or area and use a separate bathroom if available. Everyone who has presumed or confirmed COVID-19 should stay home and isolate from other people for at least 5 full days (day 0 is the first day of symptoms or the date of the day of the positive viral test for asymptomatic persons). They should wear a mask when around others at home and in public for an additional 5 days. People who are confirmed to have COVID-19 or are showing symptoms of COVID-19 need to isolate regardless of their vaccination status. This includes: People who have a positive viral test for COVID-19, regardless of whether or not they have symptoms. People with symptoms of COVID-19, including people who are awaiting test results or have not been tested. People with symptoms should isolate even if they do not know if they have been in close contact with someone with COVID-19. What to do for isolation Monitor your symptoms. If you have an emergency warning sign (including trouble breathing), seek emergency medical care immediately. Stay in a separate room from other household members, if possible. Use a separate bathroom, if possible. Take steps to improve ventilation at home, if possible. Avoid contact with other members of the household and pets. Don't share personal household items, like cups, towels, and utensils. Wear a well-fitting mask when you need to be around other people. Learn more about what to do if you are sick and how to notify your contacts. Ending isolation for people who had COVID-19 and had symptoms If you had COVID-19 and had symptoms, isolate for at least 5 days. To calculate  your 5-day isolation period, day 0 is your first day of symptoms. Day 1 is the first full day after your symptoms developed. You can leave isolation after 5 full days. You can end isolation after 5 full days if you are fever-free for 24 hours without the use of fever-reducing medication and your other symptoms have improved (Loss of taste and smell may persist for weeks or months after recovery and need not delay the end of isolation). You should continue to wear a well-fitting mask around others at home and in public for 5 additional days (day 6 through day 10) after the end of your 5-day isolation period. If you are unable to wear a mask when around others, you should continue to isolate for a full 10 days. Avoid people who have weakened immune systems or are more likely to get very sick from COVID-19, and nursing homes and other high-risk settings, until after at least 10 days. If you continue to have fever or your other symptoms have not improved after 5 days of isolation, you should wait to end your isolation until you are fever-free for 24 hours without the use of fever-reducing medication and your other symptoms have improved. Continue to wear a well-fitting mask through day 10. Contact your healthcare provider if you have questions. See additional information about travel. Do not go to places where you are unable to wear a mask, such as restaurants and some gyms, and avoid eating around others at home and at work until a full 10 days after your first day of symptoms. If an individual has access to a test and wants to test, the best approach is to use an antigen test1  towards the end of the 5-day isolation period. Collect the test sample only if you are fever-free for 24 hours without the use of fever-reducing medication and your other symptoms have improved (loss of taste and smell may persist for weeks or months after recovery and need not delay the end of isolation). If your test result is positive,  you should continue to isolate until day 10. If your test result is negative, you can end isolation, but continue to wear a well-fitting mask around others at home and in public until day 10. Follow additional recommendations for masking and avoiding travel as described above. 1As noted in the labeling for authorized over-the counter antigen tests: Negative results should be treated as presumptive. Negative results do not rule out SARS-CoV-2 infection and should not be used as the sole basis for treatment or patient management decisions, including infection control decisions. To improve results, antigen tests should be used twice over a three-day period with at least 24 hours and no more than 48 hours between tests. Note that these recommendations on ending isolation do not apply to people who are moderately ill or very sick from COVID-19 or have weakened immune systems. See section below for recommendations for when to end isolation for these groups. Ending isolation for people who tested positive for COVID-19 but had no symptoms If you test positive for COVID-19 and never develop symptoms, isolate for at least 5 days. Day 0 is the day of your positive viral test (based on the date you were tested) and day 1 is the first full day after the specimen was collected for your positive test. You can leave isolation after 5 full days. If you continue to have no symptoms, you can end isolation after at least 5 days. You should continue to wear a well-fitting mask around others at home and in public until day 10 (day 6 through day 10). If you are unable to wear a mask when around others, you should continue to isolate for 10 days. Avoid people who have weakened immune systems or are more likely to get very sick from COVID-19, and nursing homes and other high-risk settings, until after at least 10 days. If you develop symptoms after testing positive, your 5-day isolation period should start over. Day 0 is your first  day of symptoms. Follow the recommendations above for ending isolation for people who had COVID-19 and had symptoms. See additional information about travel. Do not go to places where you are unable to wear a mask, such as restaurants and some gyms, and avoid eating around others at home and at work until 10 days after the day of your positive test. If an individual has access to a test and wants to test, the best approach is to use an antigen test1 towards the end of the 5-day isolation period. If your test result is positive, you should continue to isolate until day 10. If your test result is positive, you can also choose to test daily and if your test result is negative, you can end isolation, but continue to wear a well-fitting mask around others at home and in public until day 10. Follow additional recommendations for masking and avoiding travel as described above. 1As noted in the labeling for authorized over-the counter antigen tests: Negative results should be treated as presumptive. Negative results do not rule out SARS-CoV-2 infection and should not be used as the sole basis for treatment or patient management decisions, including infection control decisions. To improve results,  antigen tests should be used twice over a three-day period with at least 24 hours and no more than 48 hours between tests. Ending isolation for people who were moderately or very sick from COVID-19 or have a weakened immune system People who are moderately ill from COVID-19 (experiencing symptoms that affect the lungs like shortness of breath or difficulty breathing) should isolate for 10 days and follow all other isolation precautions. To calculate your 10-day isolation period, day 0 is your first day of symptoms. Day 1 is the first full day after your symptoms developed. If you are unsure if your symptoms are moderate, talk to a healthcare provider for further guidance. People who are very sick from COVID-19 (this means  people who were hospitalized or required intensive care or ventilation support) and people who have weakened immune systems might need to isolate at home longer. They may also require testing with a viral test to determine when they can be around others. CDC recommends an isolation period of at least 10 and up to 20 days for people who were very sick from COVID-19 and for people with weakened immune systems. Consult with your healthcare provider about when you can resume being around other people. If you are unsure if your symptoms are severe or if you have a weakened immune system, talk to a healthcare provider for further guidance. People who have a weakened immune system should talk to their healthcare provider about the potential for reduced immune responses to COVID-19 vaccines and the need to continue to follow current prevention measures (including wearing a well-fitting mask and avoiding crowds and poorly ventilated indoor spaces) to protect themselves against COVID-19 until advised otherwise by their healthcare provider. Close contacts of immunocompromised people--including household members--should also be encouraged to receive all recommended COVID-19 vaccine doses to help protect these people. Isolation in high-risk congregate settings In certain high-risk congregate settings that have high risk of secondary transmission and where it is not feasible to cohort people (such as Systems analyst and detention facilities, homeless shelters, and cruise ships), CDC recommends a 10-day isolation period for residents. During periods of critical staffing shortages, facilities may consider shortening the isolation period for staff to ensure continuity of operations. Decisions to shorten isolation in these settings should be made in consultation with state, local, tribal, or territorial health departments and should take into consideration the context and characteristics of the facility. CDC's setting-specific  guidance provides additional recommendations for these settings. This CDC guidance is meant to supplement--not replace--any federal, state, local, territorial, or tribal health and safety laws, rules, and regulations. Recommendations for specific settings These recommendations do not apply to healthcare professionals. For guidance specific to these settings, see Healthcare professionals: Interim Guidance for Optician, dispensing with SARS-CoV-2 Infection or Exposure to SARS-CoV-2 Patients, residents, and visitors to healthcare settings: Interim Infection Prevention and Control Recommendations for Healthcare Personnel During the [place] 2019 (COVID-19) Pandemic Additional setting-specific guidance and recommendations are available. These recommendations on quarantine and isolation do apply to [place] settings. Additional guidance is available here: Overview of COVID-19 Quarantine for K-12 Schools Travelers: Travel information and recommendations Congregate facilities and other settings: [employer] for community, work, and school settings Ongoing COVID-19 exposure FAQs I live with someone with COVID-19, but I cannot be separated from them. How do we manage quarantine in this situation? It is very important for people with COVID-19 to remain apart from other people, if possible, even if they are living together. If separation of the person  with COVID-19 from others that they live with is not possible, the other people that they live with will have ongoing exposure, meaning they will be repeatedly exposed until that person is no longer able to spread the virus to other people. In this situation, there are precautions you can take to limit the spread of COVID-19: The person with COVID-19 and everyone they live with should wear a well-fitting mask inside the home. If possible, one person should care for the person with COVID-19 to limit the number of people who are in close  contact with the infected person. Take steps to protect yourself and others to reduce transmission in the home: Quarantine if you are not up to date with your COVID-19 vaccines. Isolate if you are sick or tested positive for COVID-19, even if you don't have symptoms. Learn more about the public health recommendations for testing, mask use and quarantine of close contacts, like yourself, who have ongoing exposure. These recommendations differ depending on your vaccination status. What should I do if I have ongoing exposure to COVID-19 from someone I live with? Recommendations for this situation depend on your vaccination status: If you are not up to date on COVID-19 vaccines and have ongoing exposure to COVID-19, you should: Begin quarantine immediately and continue to quarantine throughout the isolation period of the person with COVID-19. Continue to quarantine for an additional 5 days starting the day after the end of isolation for the person with COVID-19. Get tested at least 5 days after the end of isolation of the infected person that lives with them. If you test negative, you can leave the home but should continue to wear a well-fitting mask when around others at home and in public until 10 days after the end of isolation for the person with COVID-19. Isolate immediately if you develop symptoms of COVID-19 or test positive. If you are up to date with COVID-19 vaccines and have ongoing exposure to COVID-19, you should: Get tested at least 5 days after your first exposure. A person with COVID-19 is considered infectious starting 2 days before they develop symptoms, or 2 days before the date of their positive test if they do not have symptoms. Get tested again at least 5 days after the end of isolation for the person with COVID-19. Wear a well-fitting mask when you are around the person with COVID-19, and do this throughout their isolation period. Wear a well-fitting mask around others for 10 days  after the infected person's isolation period ends. Isolate immediately if you develop symptoms of COVID-19 or test positive. What should I do if multiple people I live with test positive for COVID-19 at different times? Recommendations for this situation depend on your vaccination status: If you are not up to date with your COVID-19 vaccines, you should: Quarantine throughout the isolation period of any infected person that you live with. Continue to quarantine until 5 days after the end of isolation date for the most recently infected person that lives with you. For example, if the last day of isolation of the person most recently infected with COVID-19 was June 30, the new 5-day quarantine period starts on July 1. Get tested at least 5 days after the end of isolation for the most recently infected person that lives with you. Wear a well-fitting mask when you are around any person with COVID-19 while that person is in isolation. Wear a well-fitting mask when you are around other people until 10 days after your last close contact.  Isolate immediately if you develop symptoms of COVID-19 or test positive. If you are up to date with your COVID-19 vaccines, you should: Get tested at least 5 days after your first exposure. A person with COVID-19 is considered infectious starting 2 days before they developed symptoms, or 2 days before the date of their positive test if they do not have symptoms. Get tested again at least 5 days after the end of isolation for the most recently infected person that lives with you. Wear a well-fitting mask when you are around any person with COVID-19 while that person is in isolation. Wear a well-fitting mask around others for 10 days after the end of isolation for the most recently infected person that lives with you. For example, if the last day of isolation for the person most recently infected with COVID-19 was June 30, the new 10-day period to wear a well-fitting mask  indoors in public starts on July 1. Isolate immediately if you develop symptoms of COVID-19 or test positive. I had COVID-19 and completed isolation. Do I have to quarantine or get tested if someone I live with gets COVID-19 shortly after I completed isolation? No. If you recently completed isolation and someone that lives with you tests positive for the virus that causes COVID-19 shortly after the end of your isolation period, you do not have to quarantine or get tested as long as you do not develop new symptoms. Once all of the people that live together have completed isolation or quarantine, refer to the guidance below for new exposures to COVID-19. If you had COVID-19 in the previous 90 days and then came into close contact with someone with COVID-19, you do not have to quarantine or get tested if you do not have symptoms. But you should: Wear a well-fitting mask indoors in public for 10 days after your last close contact. Monitor for COVID-19 symptoms for 10 days from the date of your last close contact. Isolate immediately and get tested if symptoms develop. If more than 90 days have passed since your recovery from infection, follow CDC's recommendations for close contacts. These recommendations will differ depending on your vaccination status. 06/21/2020 Content source: [facility] ([facility]), Division of Viral Diseases This information is not intended to replace advice given to you by your health care provider. Make sure you discuss any questions you have with your health care provider. Document Revised: 10/25/2020 Document Reviewed: 10/25/2020 Elsevier Patient Education  2022 [employer].    If you have been instructed to have an in-person evaluation today at a local Urgent Care facility, please use the link below. It will take you to a list of all of our available [facility], including address, phone number and hours of operation.  Please do not delay care.  [facility]  If you or a family member do not have a primary care provider, use the link below to schedule a visit and establish care. When you choose a [facility] primary care physician or advanced practice provider, you gain a long-term partner in health. Find a Primary Care Provider  Learn more about [facility]'s in-office and virtual care options: [facility]

## 2021-12-17 ENCOUNTER — Ambulatory Visit: Payer: BC Managed Care – PPO | Admitting: Internal Medicine

## 2021-12-17 NOTE — Progress Notes (Signed)
Pt canceled his appointment.  

## 2022-03-14 ENCOUNTER — Telehealth: Payer: BC Managed Care – PPO | Admitting: Family Medicine

## 2022-03-14 DIAGNOSIS — J069 Acute upper respiratory infection, unspecified: Secondary | ICD-10-CM | POA: Diagnosis not present

## 2022-03-14 DIAGNOSIS — J4541 Moderate persistent asthma with (acute) exacerbation: Secondary | ICD-10-CM

## 2022-03-14 MED ORDER — IPRATROPIUM-ALBUTEROL 0.5-2.5 (3) MG/3ML IN SOLN: 3.0000 mL | Freq: Four times a day (QID) | RESPIRATORY_TRACT | 0 refills | Status: DC | PRN

## 2022-03-14 MED ORDER — PREDNISONE 20 MG PO TABS: 40.0000 mg | ORAL_TABLET | Freq: Every day | ORAL | 0 refills | Status: AC

## 2022-03-14 MED ORDER — ALBUTEROL SULFATE HFA 108 (90 BASE) MCG/ACT IN AERS: 2.0000 | INHALATION_SPRAY | Freq: Four times a day (QID) | RESPIRATORY_TRACT | 0 refills | Status: DC | PRN

## 2022-03-14 MED ORDER — FLUTICASONE PROPIONATE 50 MCG/ACT NA SUSP: 2.0000 | Freq: Every day | NASAL | 0 refills | Status: DC

## 2022-03-14 MED ORDER — PSEUDOEPH-BROMPHEN-DM 30-2-10 MG/5ML PO SYRP
5.0000 mL | ORAL_SOLUTION | Freq: Four times a day (QID) | ORAL | 0 refills | Status: DC | PRN
Start: 2022-03-14 — End: 2022-08-28

## 2022-03-14 NOTE — Patient Instructions (Signed)
Roger Bender, thank you for joining Freddy Finner, NP for today's virtual visit.  While this provider is not your primary care provider (PCP), if your PCP is located in our provider database this encounter information will be shared with them immediately following your visit.   A [facility] MyChart account gives you access to today's visit and all your visits, tests, and labs performed at [facility] " click here if you don't have a [facility] MyChart account or go to [email or web address]  Consent: (Patient) Roger Bender provided verbal consent for this virtual visit at the beginning of the encounter.  Current Medications:  Current Outpatient Medications:    brompheniramine-pseudoephedrine-DM 30-2-10 MG/5ML syrup, Take 5 mLs by mouth 4 (four) times daily as needed., Disp: 120 mL, Rfl: 0   predniSONE (DELTASONE) 20 MG tablet, Take 2 tablets (40 mg total) by mouth daily with breakfast for 5 days., Disp: 10 tablet, Rfl: 0   albuterol (VENTOLIN HFA) 108 (90 Base) MCG/ACT inhaler, Inhale 2 puffs into the lungs every 6 (six) hours as needed for wheezing or shortness of breath., Disp: 18 g, Rfl: 0   buPROPion (WELLBUTRIN XL) 150 MG 24 hr tablet, Take 150 mg by mouth daily., Disp: , Rfl:    busPIRone (BUSPAR) 30 MG tablet, Take 30 mg by mouth 2 (two) times daily., Disp: , Rfl:    fluticasone (FLONASE) 50 MCG/ACT nasal spray, Place 2 sprays into both nostrils daily., Disp: 16 g, Rfl: 0   gabapentin (NEURONTIN) 300 MG capsule, Take 300 mg by mouth 3 (three) times daily., Disp: , Rfl:    ipratropium-albuterol (DUONEB) 0.5-2.5 (3) MG/3ML SOLN, Take 3 mLs by nebulization every 6 (six) hours as needed., Disp: 360 mL, Rfl: 0   omeprazole (PRILOSEC) 40 MG capsule, Take 40 mg by mouth daily., Disp: , Rfl:    Medications ordered in this encounter:  Meds ordered this encounter  Medications   predniSONE (DELTASONE) 20 MG tablet    Sig: Take 2 tablets (40 mg total) by mouth daily with  breakfast for 5 days.    Dispense:  10 tablet    Refill:  0    Order Specific Question:   Supervising Provider    Answer:   Loreli Dollar   albuterol (VENTOLIN HFA) 108 (90 Base) MCG/ACT inhaler    Sig: Inhale 2 puffs into the lungs every 6 (six) hours as needed for wheezing or shortness of breath.    Dispense:  18 g    Refill:  0    Order Specific Question:   Supervising Provider    Answer:   Merrilee Jansky [id number]   brompheniramine-pseudoephedrine-DM 30-2-10 MG/5ML syrup    Sig: Take 5 mLs by mouth 4 (four) times daily as needed.    Dispense:  120 mL    Refill:  0    Order Specific Question:   Supervising Provider    Answer:   Merrilee Jansky [id number]   ipratropium-albuterol (DUONEB) 0.5-2.5 (3) MG/3ML SOLN    Sig: Take 3 mLs by nebulization every 6 (six) hours as needed.    Dispense:  360 mL    Refill:  0    Order Specific Question:   Supervising Provider    Answer:   Merrilee Jansky [[id number]]   fluticasone (FLONASE) 50 MCG/ACT nasal spray    Sig: Place 2 sprays into both nostrils daily.    Dispense:  16 g    Refill:  0    Order  Specific Question:   Supervising Provider    Answer:   Merrilee Jansky [[id number]]     *If you need refills on other medications prior to your next appointment, please contact your pharmacy*  Follow-Up: Call back or seek an in-person evaluation if the symptoms worsen or if the condition fails to improve as anticipated.  [facility] [phone]  Other Instructions -Take meds as prescribed -Rest -Use a cool mist humidifier especially during the winter months when heat dries out the air. - Use saline nose sprays frequently to help soothe nasal passages and promote drainage. -Saline irrigations of the nose can be very helpful if done frequently.             * 4X daily for 1 week*             * Use of a nettie pot can be helpful with this.  *Follow directions with this* *Boiled or distilled water only -stay  hydrated by drinking plenty of fluids - Keep thermostat turn down low to prevent drying out sinuses - For any cough or congestion- robitussin DM or Delsym as needed - For fever or aches or pains- take tylenol or ibuprofen as directed on bottle             * for fevers greater than 101 orally you may alternate ibuprofen and tylenol every 3 hours.  If you do not improve you will need a follow up visit in person.                  If you have been instructed to have an in-person evaluation today at a local Urgent Care facility, please use the link below. It will take you to a list of all of our available [facility], including address, phone number and hours of operation. Please do not delay care.  [facility]  If you or a family member do not have a primary care provider, use the link below to schedule a visit and establish care. When you choose a [facility] primary care physician or advanced practice provider, you gain a long-term partner in health. Find a Primary Care Provider  Learn more about [facility]'s in-office and virtual care options: [facility] - Get Care Now

## 2022-03-14 NOTE — Progress Notes (Signed)
Virtual Visit Consent   Roger Bender, you are scheduled for a virtual visit with a [facility] provider today. Just as with appointments in the office, your consent must be obtained to participate. Your consent will be active for this visit and any virtual visit you may have with one of our providers in the next 365 days. If you have a MyChart account, a copy of this consent can be sent to you electronically.  As this is a virtual visit, video technology does not allow for your provider to perform a traditional examination. This may limit your provider's ability to fully assess your condition. If your provider identifies any concerns that need to be evaluated in person or the need to arrange testing (such as labs, EKG, etc.), we will make arrangements to do so. Although advances in technology are sophisticated, we cannot ensure that it will always work on either your end or our end. If the connection with a video visit is poor, the visit may have to be switched to a telephone visit. With either a video or telephone visit, we are not always able to ensure that we have a secure connection.  By engaging in this virtual visit, you consent to the provision of healthcare and authorize for your insurance to be billed (if applicable) for the services provided during this visit. Depending on your insurance coverage, you may receive a charge related to this service.  I need to obtain your verbal consent now. Are you willing to proceed with your visit today? Roger Bender has provided verbal consent on 03/14/2022 for a virtual visit (video or telephone). Freddy Finner, NP  Date: 03/14/2022 8:27 AM  Virtual Visit via Video Note   I, Freddy Finner, connected with  Roger Bender  ([id number], 01/25/1991) on 03/14/22 at  8:30 AM EST by a video-enabled telemedicine application and verified that I am speaking with the correct person using two identifiers.  Location: Patient: Virtual Visit Location Patient:  Home Provider: Virtual Visit Location Provider: Home Office   I discussed the limitations of evaluation and management by telemedicine and the availability of in person appointments. The patient expressed understanding and agreed to proceed.    History of Present Illness: Roger Bender is a 31 y.o. who identifies as a male who was assigned male at birth, and is being seen today for cold symptoms that started 6 days ago-  Current symptoms include cough- mucus in chest, nasal drainage is green- brownish. Feverish- took Motrin to help. Mild ear pain. Reports some chest tightness and shortness of breath-has asthma- uses inhaler and nebs- mild relief. No known covid or flu contacts   Problems:  Patient Active Problem List   Diagnosis Date Noted   OSA on CPAP 04/02/2021   CPAP use counseling 04/02/2021   Morbid obesity (HCC) 04/02/2021   Hypertension 04/02/2021   Anxiety 04/02/2021   Primary insomnia 06/25/2017   Insomnia, transient 09/08/2015   OSA (obstructive sleep apnea) 11/03/2013   Asthma 02/12/2012   Hay fever 02/12/2012   ADHD (attention deficit hyperactivity disorder), combined type 02/12/2012   ATTENTION DEFICIT DISORDER, INATTENTIVE TYPE 10/06/2006   ASTHMA 10/06/2006    Allergies:  Allergies  Allergen Reactions   Cat Hair Extract Shortness Of Breath   Medications:  Current Outpatient Medications:    albuterol (VENTOLIN HFA) 108 (90 Base) MCG/ACT inhaler, Inhale 2 puffs into the lungs every 6 (six) hours as needed for wheezing or shortness of breath., Disp: 18 g,  Rfl: 0   buPROPion (WELLBUTRIN XL) 150 MG 24 hr tablet, Take 150 mg by mouth daily., Disp: , Rfl:    busPIRone (BUSPAR) 30 MG tablet, Take 30 mg by mouth 2 (two) times daily., Disp: , Rfl:    fluticasone (FLONASE) 50 MCG/ACT nasal spray, Place 2 sprays into both nostrils daily., Disp: 16 g, Rfl: 0   gabapentin (NEURONTIN) 300 MG capsule, Take 300 mg by mouth 3 (three) times daily., Disp: , Rfl:     ipratropium-albuterol (DUONEB) 0.5-2.5 (3) MG/3ML SOLN, Take 3 mLs by nebulization every 6 (six) hours as needed., Disp: 360 mL, Rfl: 0   omeprazole (PRILOSEC) 40 MG capsule, Take 40 mg by mouth daily., Disp: , Rfl:   Observations/Objective: Patient is well-developed, well-nourished in no acute distress.  Resting comfortably  at home.  Head is normocephalic, atraumatic.  No labored breathing.  Speech is clear and coherent with logical content.  Patient is alert and oriented at baseline.  Congestion tone noted  Assessment and Plan:  1. Moderate persistent asthma with acute exacerbation  - albuterol (VENTOLIN HFA) 108 (90 Base) MCG/ACT inhaler; Inhale 2 puffs into the lungs every 6 (six) hours as needed for wheezing or shortness of breath.  Dispense: 18 g; Refill: 0 - ipratropium-albuterol (DUONEB) 0.5-2.5 (3) MG/3ML SOLN; Take 3 mLs by nebulization every 6 (six) hours as needed.  Dispense: 360 mL; Refill: 0  2. Viral URI with cough  - predniSONE (DELTASONE) 20 MG tablet; Take 2 tablets (40 mg total) by mouth daily with breakfast for 5 days.  Dispense: 10 tablet; Refill: 0 - brompheniramine-pseudoephedrine-DM 30-2-10 MG/5ML syrup; Take 5 mLs by mouth 4 (four) times daily as needed.  Dispense: 120 mL; Refill: 0 aily.  Dispense: 16 g; Refill: 0   -Take meds as prescribed -Rest -Use a cool mist humidifier especially during the winter months when heat dries out the air. - Use saline nose sprays frequently to help soothe nasal passages and promote drainage. -Saline irrigations of the nose can be very helpful if done frequently.             * 4X daily for 1 week*             * Use of a nettie pot can be helpful with this.  *Follow directions with this* *Boiled or distilled water only -stay hydrated by drinking plenty of fluids - Keep thermostat turn down low to prevent drying out sinuses - For any cough or congestion- robitussin DM or Delsym as needed - For fever or aches or pains- take  tylenol or ibuprofen as directed on bottle             * for fevers greater than 101 orally you may alternate ibuprofen and tylenol every 3 hours.  If you do not improve you will need a follow up visit in person.                 Reviewed side effects, risks and benefits of medication.    Patient acknowledged agreement and understanding of the plan.   Past Medical, Surgical, Social History, Allergies, and Medications have been Reviewed.    Follow Up Instructions: I discussed the assessment and treatment plan with the patient. The patient was provided an opportunity to ask questions and all were answered. The patient agreed with the plan and demonstrated an understanding of the instructions.  A copy of instructions were sent to the patient via MyChart unless otherwise noted below.  The patient was advised to call back or seek an in-person evaluation if the symptoms worsen or if the condition fails to improve as anticipated.  Time:  I spent 10 minutes with the patient via telehealth technology discussing the above problems/concerns.    Perlie Mayo, NP

## 2022-08-28 ENCOUNTER — Ambulatory Visit: Admission: EM | Admit: 2022-08-28 | Discharge: 2022-08-28 | Payer: BC Managed Care – PPO

## 2022-08-28 DIAGNOSIS — S86002A Unspecified injury of left Achilles tendon, initial encounter: Secondary | ICD-10-CM

## 2022-08-28 NOTE — ED Provider Notes (Signed)
[facility]    CSN: [id number] Arrival date & time: 08/28/22  1533      History   Chief Complaint Chief Complaint  Patient presents with   Calf Pain    HPI Roger Bender is a 32 y.o. male.   HPI  With a past medical history significant for OSA, asthma, ADHD, and anxiety presenting for evaluation of pain in the left calf.  He reports that he started exercising 2 months ago and that approximately a week and a half ago the pain began.  Today he was playing pickle ball and while playing he heard a pop and felt a sharp pain in his calf.  He also reports he has some numbness in his toes.  Past Medical History:  Diagnosis Date   ADHD    Asthma    Hemorrhoids    Prehypertension     Patient Active Problem List   Diagnosis Date Noted   OSA on CPAP 04/02/2021   CPAP use counseling 04/02/2021   Morbid obesity (HCC) 04/02/2021   Hypertension 04/02/2021   Anxiety 04/02/2021   Primary insomnia 06/25/2017   Insomnia, transient 09/08/2015   OSA (obstructive sleep apnea) 11/03/2013   Asthma 02/12/2012   Hay fever 02/12/2012   ADHD (attention deficit hyperactivity disorder), combined type 02/12/2012   ATTENTION DEFICIT DISORDER, INATTENTIVE TYPE 10/06/2006   ASTHMA 10/06/2006    Past Surgical History:  Procedure Laterality Date   ADENOIDECTOMY         Home Medications    Prior to Admission medications   Medication Sig Start Date End Date Taking? Authorizing Provider  eszopiclone (LUNESTA) 1 MG TABS tablet Take 1 mg by mouth at bedtime. 08/12/22  Yes [provider]  QELBREE 200 MG 24 hr capsule Take 200 mg by mouth daily. 07/27/22  Yes [provider]    Family History Family History  Problem Relation Age of Onset   Healthy Mother    Depression Father     Social History Social History   Tobacco Use   Smoking status: Never   Smokeless tobacco: Never  Vaping Use   Vaping Use: Never used  Substance Use Topics   Alcohol use: No    Drug use: No     Allergies   Cat hair extract   Review of Systems Review of Systems  Musculoskeletal:  Positive for myalgias.  Neurological:  Positive for numbness. Negative for weakness.     Physical Exam Triage Vital Signs ED Triage Vitals [08/28/22 1537]  Enc Vitals Group     BP      Pulse      Resp 16     Temp      Temp Source Oral     SpO2      Weight      Height      Head Circumference      Peak Flow      Pain Score      Pain Loc      Pain Edu?      Excl. in GC?    No data found.  Updated Vital Signs BP (!) 147/109 (BP Location: Right Arm)   Pulse 80   Temp 98.7 F (37.1 C) (Oral)   Resp 16   Ht 6' (1.829 m)   Wt 280 lb (127 kg)   SpO2 95%   BMI 37.97 kg/m   Visual Acuity Right Eye Distance:   Left Eye Distance:   Bilateral Distance:  Right Eye Near:   Left Eye Near:    Bilateral Near:     Physical Exam Vitals and nursing note reviewed.  Constitutional:      Appearance: Normal appearance. He is not ill-appearing.  HENT:     Head: Normocephalic and atraumatic.  Musculoskeletal:        General: Swelling, tenderness and signs of injury present. No deformity.  Skin:    General: Skin is warm and dry.     Capillary Refill: Capillary refill takes less than 2 seconds.     Findings: No bruising.  Neurological:     General: No focal deficit present.     Mental Status: He is alert and oriented to person, place, and time.      UC Treatments / Results  Labs (all labs ordered are listed, but only abnormal results are displayed) Labs Reviewed - No data to display  EKG   Radiology No results found.  Procedures Procedures (including critical care time)  Medications Ordered in UC Medications - No data to display  Initial Impression / Assessment and Plan / UC Course  I have reviewed the triage vital signs and the nursing notes.  Pertinent labs & imaging results that were available during my care of the patient were reviewed by me  and considered in my medical decision making (see chart for details).   Patient is a pleasant, nontoxic-appearing 32 year old male presenting for evaluation of pain in his left calf that started a week and a half ago and intensified today while playing pickle ball.  He is tender where his gastrocnemius emerges with his Achilles tendon.  There is no palpable defect though patient is exquisitely tender for deep palpation.  He has full range of motion of his foot and ankle but resisted plantarflexion causes an increase in pain.  DP and PT pulses are 2+.  The area of tenderness is warm to touch but there is no appreciable ecchymosis or erythema.  Given patient's mechanism of injury and his walking with a flat-footed gait on his left leg there is a concern for calf muscle strain versus Achilles or tendon strain or rupture.  I advised the patient that he needs imaging that we cannot perform the urgent care in the way of either an MS[facility] ultrasound or MRI.  I have referred him to the new [facility] here in [place].  He left ambulatory and in stable condition.   Final Clinical Impressions(s) / UC Diagnoses   Final diagnoses:  Achilles tendon injury, left, initial encounter     Discharge Instructions      Please proceed to the orthopedic urgent care at [facility] for further evaluation and imaging of your left calf pain and questionable Achilles tendon injury.     ED Prescriptions   None    PDMP not reviewed this encounter.   Becky Augusta, NP 08/28/22 [phone]

## 2022-08-28 NOTE — Discharge Instructions (Addendum)
Please proceed to the orthopedic urgent care at [facility] for further evaluation and imaging of your left calf pain and questionable Achilles tendon injury.

## 2022-08-28 NOTE — ED Triage Notes (Signed)
Pt c/o L calf pain x7 days. States he started exercising 2 mon ago & about 1 1/2 wk ago pain started, was playing some pickle ball today & while playing her heard a pop in his calf, area is warm to touch.

## 2023-09-04 IMAGING — CR DG CHEST 2V
2 series · 2 of 2 positions shown · non-contrast
Comparison: April 13, 2018.

CLINICAL DATA: Chest pain.

EXAM:
CHEST - 2 VIEW

[chest pa]
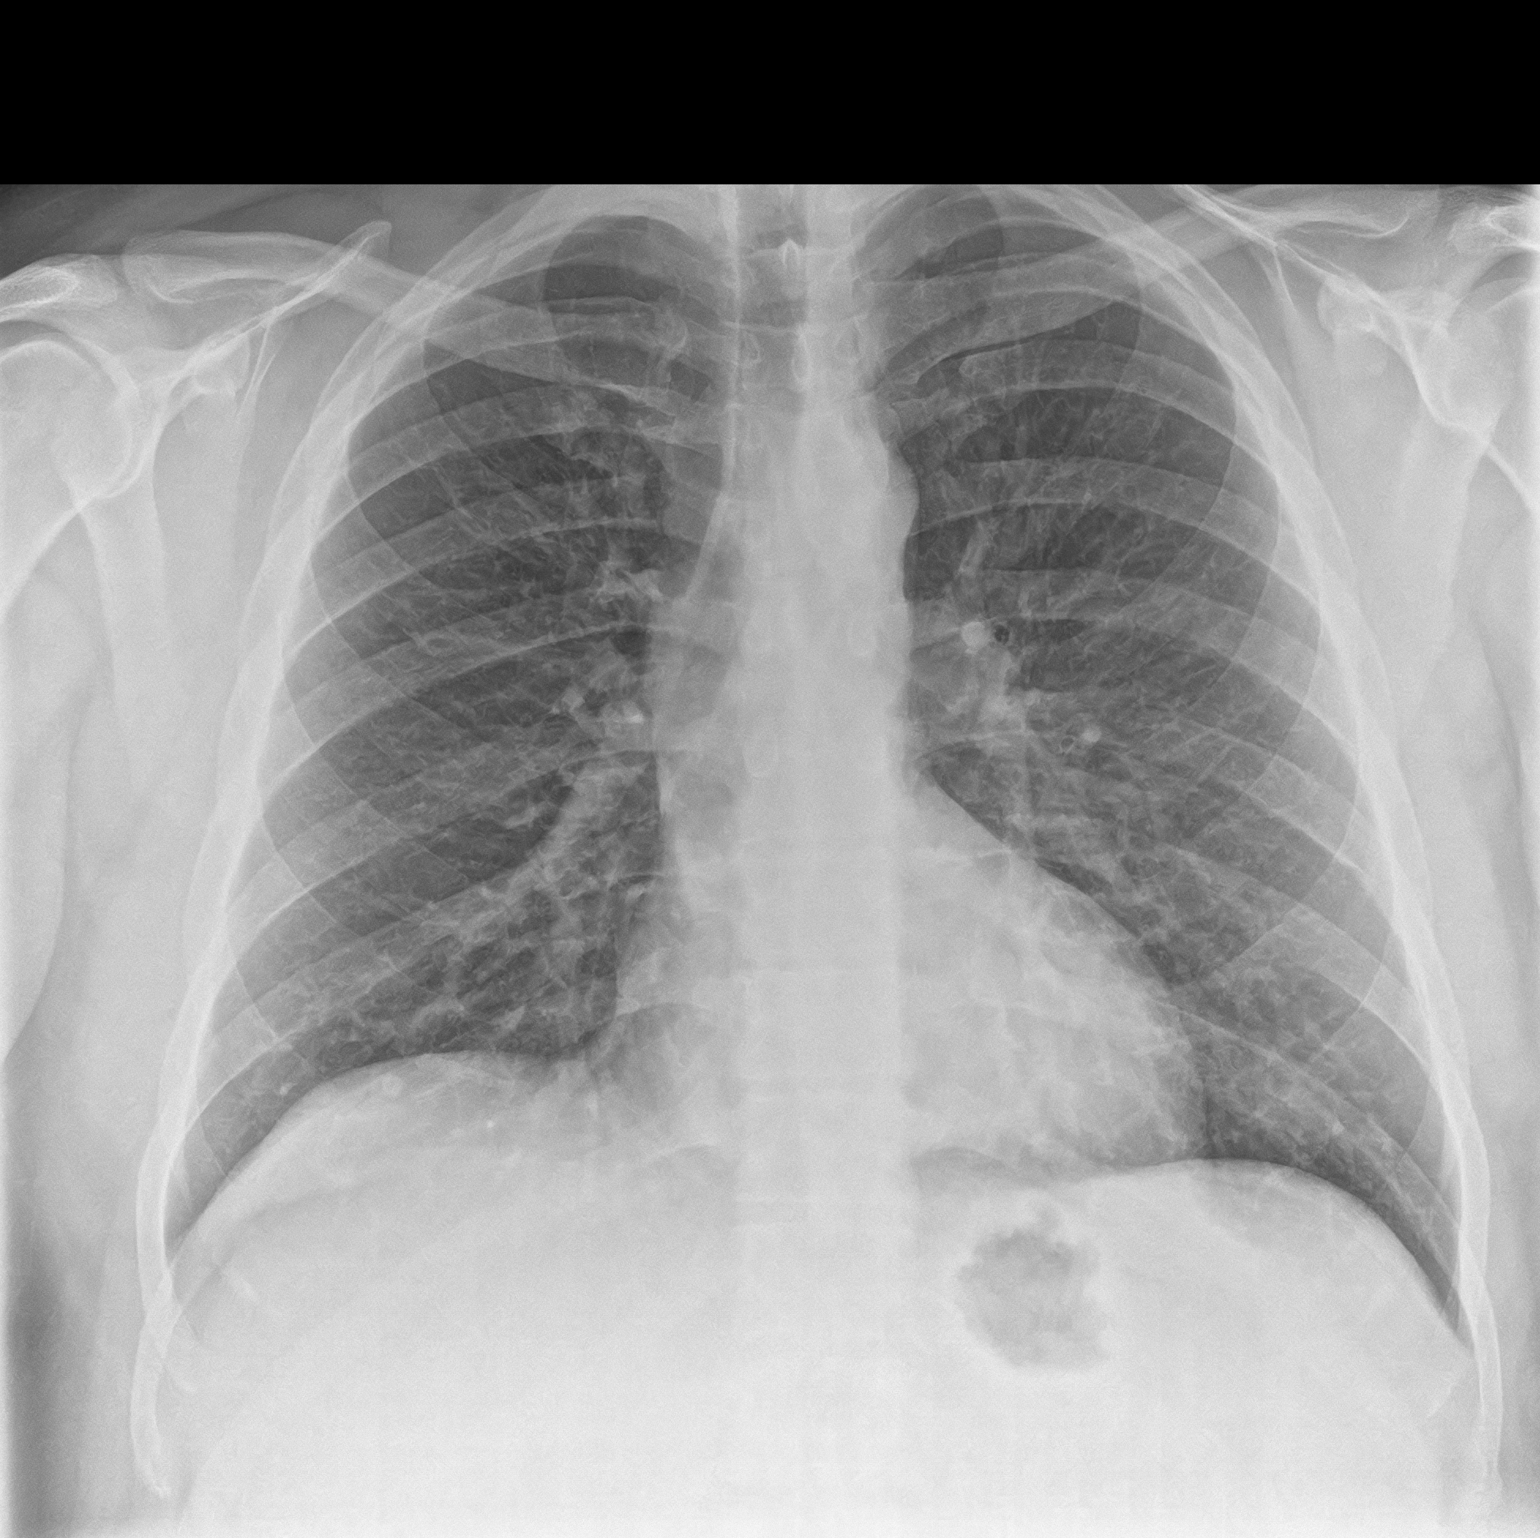

[chest lat]
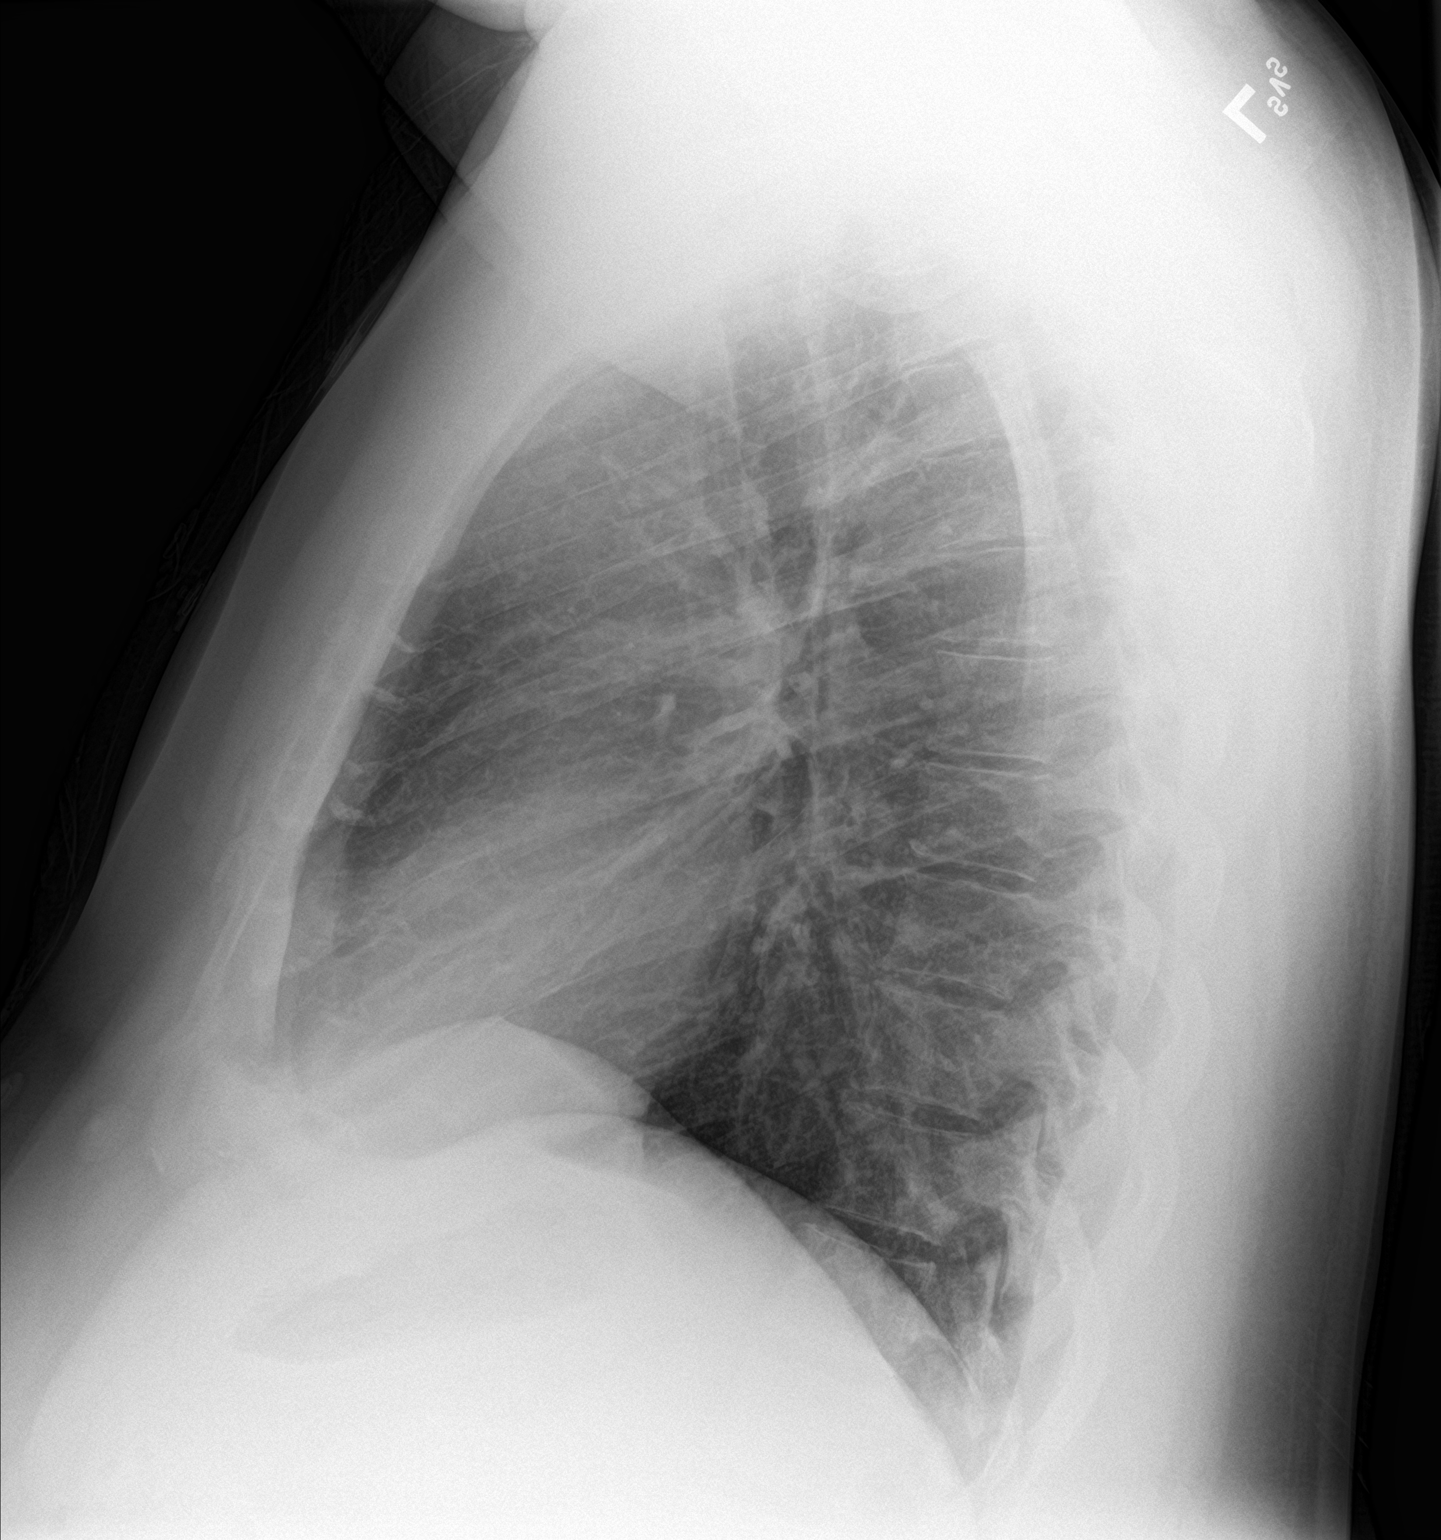

[2 of 2 positions shown; findings below may reference images not displayed]

FINDINGS: The heart size and mediastinal contours are within normal limits.
Both lungs are clear. The visualized skeletal structures are
unremarkable.
IMPRESSION: No active cardiopulmonary disease.

## 2023-10-20 NOTE — Progress Notes (Signed)
 Assessment and Plan:     Primary hypertension - BP at goal/ - Patient is trying to lose weight on his own.  - DASH diet discussed -Continue  losartan-hydroCHLOROthiazide (HYZAAR) 50-12.5 mg per tablet; Take 1 tablet by mouth daily after risks, benefits, side effects discussed and patient was agreeable to proceed.   Hypertriglyceridemia - Lipids at goal. - 11/05/22 Triglycerides were improved to 90 from 180 and will continue to monitor.  LDL 90  at goal.  -  Recheck fasting lipids today. - Lipid Panel - Comprehensive Metabolic Panel  Acquired hypothyroidism Patient remains asymptomatic Patient thyroid function was increase on 04/21/23  TSH 9 and T4 was 1.223 and will check TFTs today and adjust Thyroid replacement prn Current tx: Levothyroxine 25 mcg po qd    Morbid obesity (CMS-HCC)  - BMI  43 - Patient is using weight loss app and has been successful in losing weight but request more extensive supervised program. - Patient was advised that Gabapentin may be causing his weight gain and discuss w/ psychiatrist about decreasing and stopping medication and prescribing weight neutral medication for weight loss.  - Patient has decreased Gabapentin 300 mg po TID - Amb Referral for Weight Management; Future   Mild intermittent asthma without complication (HHS-HCC) - Symptoms controlled on current inhaler.  - albuterol  HFA 90 mcg/actuation inhaler; Inhale 2 puffs every six (6) hours as needed for wheezing. Encounter for weight management - Patient is using weight loss app and has been successful in losing weight but request more extensive supervised program.   - Amb Referral for Weight Management; Future Depression: Patient is following with Roger Bender. Psychiatrist on line.  Patient takes Bupropion XL 300 mg po every day. ADHD: Patient is taking Qelbree 200 mg 2 tablets daily per Roger Bender. Psychiatry on line.  Anxiety: Patient is taking Gabapentin 300 mg po TID tapering from QID. Patient  is also taking Buspar 30 mgpo every BID per Roger Bender. Psychiatry on line.  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 Insomnia: Patient is taking trazodone 50 mg 1/4 po at bedtime.  Encounter for vasectomy - Patient has two children and per wife and patient he is done having children and requests vasectomy. - Ambulatory referral to Urology; Future - Patient had appt for 03/06/23 with Roger Bender and could not afford and patient deferred treatment.    I personally spent 45 minutes face-to-face and non-face-to-face in the care of this patient, which includes all pre, intra, and post visit time on the date of service. All documented time was specific to the E/M visit and does not include any procedures that may have been performed.  Subjective:   HPI: Roger Bender is a 33 y.o. male here for Follow-up (Patient is fasting and have bilateral foot pain) Patient was following with [facility] in [place]. Roger Lavender, MD and is transferring care.   Patient is a Pension scheme manager at [employer].   33 y.o. pmh asthma, morbid obesity, ADHD, anxiety HTN elevated TSH presents for follow up.  Hypertension: Patient follows up for BP check. A few appts ago patient was started on Hyzaar 50-12.5 mg po every day and BP is normal today.   Depression: Patient is following with Roger Bender. Psychiatrist on line.  Patient takes Bupropion XL 300 mg po every day. ADHD: Patient is taking Qelbree 200 mg 2 tablets daily per Roger Bender. Psychiatry on line.  Anxiety: Patient  is taking Gabapentin 300 mg po TID tapering from QID. Patient is also taking Buspar 30 mgpo every BID per Roger Bender. Psychiatry on line.  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 Insomnia: Patient is taking trazodone 50 mg 1/4 po at bedtime.  Morbid obesity: [name] has been eating less calories and has  been dieting. Patient states he has lost from 320 ---303. Requests referral to weight management. Vasectomy: referral for procedure was entered and patient saw Urologist but deferred procedure secondary to cost.Patient  has two children and is done having kids. HM: - Patient declined Influenza vaccine.  - Tdap vaccine given today. Past Medical History:  Diagnosis Date  . Allergies   . Depression     Social History   Social History Narrative  . Not on file   Tobacco Use: High Risk (10/20/2023)   Patient History   . Smoking Tobacco Use: Never   . Smokeless Tobacco Use: Current   . Passive Exposure: Past     Health Maintenance Due  Topic Date Due  . COVID-19 Vaccine (3 - 2024-25 season) 11/24/2022  . DTaP/Tdap/Td Vaccines (2 - Td or Tdap) 05/01/2023     ROS negative unless otherwise noted in HPI.  Allergies:   Cat dander and Cat hair standardized allergenic extract  Current Medications:   Current Outpatient Medications  Medication Sig Dispense Refill  . busPIRone (BUSPAR) 30 MG tablet Take 50 mg by mouth two (2) times a day.    . gabapentin (NEURONTIN) 300 MG capsule Take 1 capsule (300 mg total) by mouth Three (3) times a day. (Patient taking differently: Take 1 capsule (300 mg total) by mouth Three (3) times a day. Takes up to QID)    . levothyroxine (SYNTHROID) 25 MCG tablet TAKE 1 TABLET BY MOUTH DAILY ON AN EMPTY STOMACH 90 tablet 1  . loratadine (CLARITIN) 5 mg chewable tablet Chew 1 tablet (5 mg total) daily. (Patient taking differently: Chew 2 tablets (10 mg total) in the morning.)    . QELBREE 200 mg Cp24 Take 1 capsule (200 mg total) by mouth daily.    . traZODone (DESYREL) 50 MG tablet Take 1 tablet (50 mg total) by mouth nightly. Patient takes 3/4 nightly    . albuterol  HFA 90 mcg/actuation inhaler Inhale 2 puffs every six (6) hours as needed for wheezing. 6.7 g 1  . buPROPion (WELLBUTRIN XL) 300 MG 24 hr tablet Take 1 tablet (300 mg total) by mouth.    .  losartan-hydroCHLOROthiazide (HYZAAR) 50-12.5 mg per tablet Take 1 tablet by mouth daily. 90 tablet 1   No current facility-administered medications for this visit.    Objective:   Vitals:   10/20/23 0850  BP: 134/84  Pulse: 80  Temp: 35.6 C (96.1 F)  SpO2: 96%   Body mass index is 43.48 kg/m.  Physical Exam Constitutional:      General: He is not in acute distress.    Appearance: Normal appearance. He is obese. He is not toxic-appearing.  HENT:     Head: Normocephalic and atraumatic.     Right Ear: Tympanic membrane, ear canal and external ear normal.     Left Ear: Tympanic membrane, ear canal and external ear normal.     Nose: Nose normal. No congestion or rhinorrhea.     Mouth/Throat:     Mouth: Mucous membranes are moist.     Pharynx: Oropharynx is clear.  Eyes:     Extraocular Movements:  Extraocular movements intact.     Conjunctiva/sclera: Conjunctivae normal.     Pupils: Pupils are equal, round, and reactive to light.  Neck:     Vascular: No carotid bruit.  Cardiovascular:     Rate and Rhythm: Normal rate and regular rhythm.     Pulses: Normal pulses.     Heart sounds: Normal heart sounds. No murmur heard.    No gallop.  Pulmonary:     Effort: Pulmonary effort is normal. No respiratory distress.     Breath sounds: Normal breath sounds. No wheezing or rales.  Abdominal:     General: Abdomen is flat. Bowel sounds are normal.     Palpations: Abdomen is soft.  Musculoskeletal:     Cervical back: Normal range of motion and neck supple. No rigidity or tenderness.  Lymphadenopathy:     Cervical: No cervical adenopathy.  Skin:    General: Skin is warm and dry.     Coloration: Skin is not jaundiced.     Findings: No bruising or lesion.  Neurological:     General: No focal deficit present.     Mental Status: He is alert and oriented to person, place, and time. Mental status is at baseline.  Psychiatric:        Mood and Affect: Mood normal.        Thought  Content: Thought content normal.        Judgment: Judgment normal.      No results found for this visit on 10/20/23.  Slater MARLA Sole, M.D.   Internal Medicine [facility] [place] [address] [place], [place] [address]

## 2023-11-04 ENCOUNTER — Encounter: Payer: Self-pay | Admitting: Emergency Medicine

## 2023-11-04 ENCOUNTER — Emergency Department

## 2023-11-04 ENCOUNTER — Other Ambulatory Visit: Payer: Self-pay

## 2023-11-04 ENCOUNTER — Emergency Department
Admission: EM | Admit: 2023-11-04 | Discharge: 2023-11-04 | Disposition: A | Discharge status: Home / Self Care | Attending: Emergency Medicine | Admitting: Emergency Medicine

## 2023-11-04 DIAGNOSIS — R0981 Nasal congestion: Secondary | ICD-10-CM | POA: Diagnosis not present

## 2023-11-04 DIAGNOSIS — R202 Paresthesia of skin: Secondary | ICD-10-CM | POA: Insufficient documentation

## 2023-11-04 DIAGNOSIS — E860 Dehydration: Secondary | ICD-10-CM | POA: Diagnosis not present

## 2023-11-04 DIAGNOSIS — R112 Nausea with vomiting, unspecified: Secondary | ICD-10-CM

## 2023-11-04 DIAGNOSIS — F419 Anxiety disorder, unspecified: Secondary | ICD-10-CM | POA: Diagnosis not present

## 2023-11-04 DIAGNOSIS — I10 Essential (primary) hypertension: Secondary | ICD-10-CM | POA: Diagnosis not present

## 2023-11-04 DIAGNOSIS — E876 Hypokalemia: Secondary | ICD-10-CM | POA: Insufficient documentation

## 2023-11-04 DIAGNOSIS — R000 Tachycardia, unspecified: Secondary | ICD-10-CM | POA: Diagnosis not present

## 2023-11-04 DIAGNOSIS — R1013 Epigastric pain: Secondary | ICD-10-CM

## 2023-11-04 DIAGNOSIS — R059 Cough, unspecified: Secondary | ICD-10-CM | POA: Insufficient documentation

## 2023-11-04 LAB — URINALYSIS, ROUTINE W REFLEX MICROSCOPIC
Bilirubin Urine: NEGATIVE
Glucose, UA: NEGATIVE mg/dL
Ketones, ur: 80 mg/dL — AB
Leukocytes,Ua: NEGATIVE
Nitrite: NEGATIVE
Protein, ur: 100 mg/dL — AB
RBC / HPF: >50 RBC/hpf (ref 0–5)
Specific Gravity, Urine: 1.032 — ABNORMAL HIGH (ref 1.005–1.030)
pH: 6 (ref 5.0–8.0)

## 2023-11-04 LAB — COMPREHENSIVE METABOLIC PANEL WITH GFR
ALT: 42 U/L (ref 0–44)
AST: 40 U/L (ref 15–41)
Albumin: 3.7 g/dL (ref 3.5–5.0)
Alkaline Phosphatase: 52 U/L (ref 38–126)
Anion gap: 13 (ref 5–15)
BUN: 16 mg/dL (ref 6–20)
CO2: 20 mmol/L — ABNORMAL LOW (ref 22–32)
Calcium: 8.3 mg/dL — ABNORMAL LOW (ref 8.9–10.3)
Chloride: 103 mmol/L (ref 98–111)
Creatinine, Ser: 0.94 mg/dL (ref 0.61–1.24)
GFR, Estimated: >60 mL/min (ref >60)
Glucose, Bld: 102 mg/dL — ABNORMAL HIGH (ref 70–99)
Potassium: 3.2 mmol/L — ABNORMAL LOW (ref 3.5–5.1)
Sodium: 136 mmol/L (ref 135–145)
Total Bilirubin: 0.9 mg/dL (ref 0.0–1.2)
Total Protein: 7.1 g/dL (ref 6.5–8.1)

## 2023-11-04 LAB — CBC WITH DIFFERENTIAL/PLATELET
Abs Immature Granulocytes: 0.03 K/uL (ref 0.00–0.07)
Basophils Absolute: 0 K/uL (ref 0.0–0.1)
Basophils Relative: 0 %
Eosinophils Absolute: 0 K/uL (ref 0.0–0.5)
Eosinophils Relative: 0 %
HCT: 43.4 % (ref 39.0–52.0)
Hemoglobin: 15.3 g/dL (ref 13.0–17.0)
Immature Granulocytes: 1 %
Lymphocytes Relative: 8 %
Lymphs Abs: 0.4 K/uL — ABNORMAL LOW (ref 0.7–4.0)
MCH: 29.1 pg (ref 26.0–34.0)
MCHC: 35.3 g/dL (ref 30.0–36.0)
MCV: 82.5 fL (ref 80.0–100.0)
Monocytes Absolute: 0.5 K/uL (ref 0.1–1.0)
Monocytes Relative: 10 %
Neutro Abs: 4.5 K/uL (ref 1.7–7.7)
Neutrophils Relative %: 81 %
Platelets: 147 K/uL — ABNORMAL LOW (ref 150–400)
RBC: 5.26 MIL/uL (ref 4.22–5.81)
RDW: 12.2 % (ref 11.5–15.5)
WBC: 5.4 K/uL (ref 4.0–10.5)
nRBC: 0 % (ref 0.0–0.2)

## 2023-11-04 LAB — D-DIMER, QUANTITATIVE: D-Dimer, Quant: 0.42 ug{FEU}/mL (ref 0.00–0.50)

## 2023-11-04 LAB — MAGNESIUM: Magnesium: 1.7 mg/dL (ref 1.7–2.4)

## 2023-11-04 LAB — LIPASE, BLOOD: Lipase: 29 U/L (ref 11–51)

## 2023-11-04 LAB — TROPONIN I (HIGH SENSITIVITY): Troponin I (High Sensitivity): 4 ng/L (ref <18)

## 2023-11-04 MED ORDER — SODIUM CHLORIDE 0.9 % IV BOLUS
500.0000 mL | Freq: Once | INTRAVENOUS | Status: AC
  Administered 2023-11-04 (×2): 500 mL via INTRAVENOUS

## 2023-11-04 MED ORDER — ONDANSETRON 4 MG PO TBDP: 4.0000 mg | ORAL_TABLET | Freq: Three times a day (TID) | ORAL | 0 refills | Status: AC | PRN

## 2023-11-04 MED ORDER — POTASSIUM CHLORIDE 20 MEQ PO PACK
40.0000 meq | PACK | Freq: Once | ORAL | Status: AC
  Administered 2023-11-04 (×2): 40 meq via ORAL
  Filled 2023-11-04: qty 2

## 2023-11-04 MED ORDER — ONDANSETRON HCL 4 MG/2ML IJ SOLN
4.0000 mg | Freq: Once | INTRAMUSCULAR | Status: AC
  Administered 2023-11-04 (×2): 4 mg via INTRAVENOUS
  Filled 2023-11-04: qty 2

## 2023-11-04 MED ORDER — LORAZEPAM 0.5 MG PO TABS
0.5000 mg | ORAL_TABLET | Freq: Once | ORAL | Status: AC
  Administered 2023-11-04 (×2): 0.5 mg via ORAL
  Filled 2023-11-04: qty 1

## 2023-11-04 MED ORDER — LORAZEPAM 2 MG/ML IJ SOLN
0.5000 mg | Freq: Once | INTRAMUSCULAR | Status: AC
  Administered 2023-11-04 (×2): 0.5 mg via INTRAVENOUS
  Filled 2023-11-04: qty 1

## 2023-11-04 NOTE — ED Triage Notes (Addendum)
 Pt arrives via EMS from home for c/o N/V since Saturday pm with left lower abd pain

## 2023-11-04 NOTE — Discharge Instructions (Addendum)
 You are seen in the emergency department for nausea, vomiting, not feeling well and anxiety.  You were given anxiety medication in the emergency department.  Your potassium was mildly low.  You were given oral potassium replacement, IV fluids and nausea medication.  Your heart enzymes were normal, do not believe you are having a heart attack today.  Do not believe that you are having any signs of a blood clot.  Is importantly follow-up closely with your primary care physician.  Stay hydrated and drink plenty fluids.  You are given a prescription for nausea medication.  Return to the emergency department if you have any ongoing or worsening symptoms.  zofran  (ondansetron ) - nausea medication, take 1 tablet every 8 hours as needed for nausea/vomiting.  Your potassium was mildly low when checked today.  Make sure to follow up with a primary doctor to follow up your labs.  Make sure to eat food high in potassium and magnesium - examples - potatoes, spinach, bananas, beans, avocadoes, oranges, nuts.

## 2023-11-04 NOTE — ED Provider Notes (Signed)
 [facility] Provider Note    Event Date/Time   First MD Initiated Contact with Patient 11/04/23 [phone]     (approximate)   History   Abdominal Pain   HPI  Roger Bender is a 33 y.o. male past medical tree significant for anxiety, hyperlipidemia, hypertension, obesity, presents to the emergency department with nausea and vomiting and not feeling well.  States that he said 3 days of cough, congestion, nausea and vomiting.  Feeling dehydrated.  States that he has chest pain only with coughing to the left side of his chest.  No chest pain at rest.  Denies any abdominal pain at this time.  States that he had intermittent episodes of left lower abdominal pain but no abdominal pain at this time.  Has a history of constipation which is not new for him.  Denies any new medications.  States that he is feeling very anxious and having an anxiety attack.  Denies prior abdominal surgery.  No urinary symptoms.  Denies any radiation to his testicle.  No history of urinary tract infection.  Patient's 30-year-old daughter has had similar symptoms over the past couple of days.  No fever.  Complaining of intermittent paresthesias to his fingertips.     Physical Exam   Triage Vital Signs: ED Triage Vitals  Encounter Vitals Group     BP 11/04/23 0632 (!) 141/99     Girls Systolic BP Percentile --      Girls Diastolic BP Percentile --      Boys Systolic BP Percentile --      Boys Diastolic BP Percentile --      Pulse Rate 11/04/23 0632 100     Resp 11/04/23 0632 20     Temp 11/04/23 0632 99.4 F (37.4 C)     Temp Source 11/04/23 0632 Oral     SpO2 11/04/23 0625 99 %     Weight 11/04/23 0631 289 lb (131.1 kg)     Height 11/04/23 0631 5' 11 (1.803 m)     Head Circumference --      Peak Flow --      Pain Score 11/04/23 0631 2     Pain Loc --      Pain Education --      Exclude from Growth Chart --     Most recent vital signs: Vitals:   11/04/23 1030 11/04/23 1100  BP:  120/86 128/75  Pulse: 92 97  Resp: 17 14  Temp:    SpO2: 100% 95%    Physical Exam Constitutional:      Appearance: He is well-developed.  HENT:     Head: Atraumatic.  Eyes:     Conjunctiva/sclera: Conjunctivae normal.  Cardiovascular:     Rate and Rhythm: Regular rhythm. Tachycardia present.  Pulmonary:     Effort: No respiratory distress.     Breath sounds: No wheezing, rhonchi or rales.  Chest:     Chest wall: No tenderness.  Abdominal:     General: Abdomen is flat.     Palpations: Abdomen is soft.     Tenderness: There is no abdominal tenderness.  Musculoskeletal:     Cervical back: Normal range of motion.  Skin:    General: Skin is warm.     Capillary Refill: Capillary refill takes less than 2 seconds.  Neurological:     General: No focal deficit present.     Mental Status: He is alert. Mental status is at baseline.  Psychiatric:  Mood and Affect: Mood is anxious.     IMPRESSION / MDM / ASSESSMENT AND PLAN / ED COURSE  I reviewed the triage vital signs and the nursing notes.  Afebrile, mild tachycardia on arrival and normotensive, 97% on room air.  No tachypnea.  Differential diagnosis including dehydration, viral illness including COVID/influenza, gastritis, pneumonia, electrolyte abnormality  Have a very low suspicion for acute appendicitis, no right lower quadrant abdominal tenderness to palpation and no rebound or guarding.  Low suspicion for acute diverticulitis or intra-abdominal pathology given no abdominal pain and no abdominal tenderness to exam at this time.  Have low suspicion for ACS, no chest pain and only having some mild pain with coughing.  Patient arrived via EMS and was given IV Zofran  with improvement of his symptoms.   EKG  I, Clotilda Punter, the attending physician, personally viewed and interpreted this ECG.  Normal sinus rhythm with a heart rate of 98.  Normal intervals.  QTc 451.  No significant ST elevation or depression.  No  findings of acute ischemia or dysrhythmia.  Sinus tachycardia  while on cardiac telemetry.  RADIOLOGY I independently reviewed imaging, my interpretation of imaging: Chest x-ray -no acute findings.  Read as no active disease.  LABS (all labs ordered are listed, but only abnormal results are displayed) Labs interpreted as -    Labs Reviewed  CBC WITH DIFFERENTIAL/PLATELET - Abnormal; Notable for the following components:      Result Value   Platelets 147 (*)    Lymphs Abs 0.4 (*)    All other components within normal limits  COMPREHENSIVE METABOLIC PANEL WITH GFR - Abnormal; Notable for the following components:   Potassium 3.2 (*)    CO2 20 (*)    Glucose, Bld 102 (*)    Calcium 8.3 (*)    All other components within normal limits  URINALYSIS, ROUTINE W REFLEX MICROSCOPIC - Abnormal; Notable for the following components:   Color, Urine YELLOW (*)    APPearance HAZY (*)    Specific Gravity, Urine 1.032 (*)    Hgb urine dipstick LARGE (*)    Ketones, ur 80 (*)    Protein, ur 100 (*)    Bacteria, UA RARE (*)    All other components within normal limits  LIPASE, BLOOD  MAGNESIUM  D-DIMER, QUANTITATIVE  TROPONIN I (HIGH SENSITIVITY)     MDM  Lab work with no significant leukocytosis.  Creatinine is at his baseline.  Potassium mildly low at 3.2.  CO2 is mildly low at 20.  Normal LFTs.  No anion gap.  Normal lipase.  Magnesium level normal at 1.7.  Patient given IV fluids, antiemetics, potassium oral repletion.  Patient complaining of severe anxiety, so given 0.5 mg of p.o. Ativan .  States that his mother is coming to pick him up.  Patient continued to be tachycardic, given his chest pain will do a screening D-dimer test and is otherwise low risk Wells criteria.  Will add on a troponin.  Given another dose of IV fluids given significant ketones in his urine.  On reevaluation continues to have anxiety but states he is feeling much better.  Able to tolerate p.o.  Given p.o.  potassium replacement.  Screening D-dimer test is negative and have low suspicion for pulmonary embolism.  Troponin negative, have low suspicion for ACS.  Most likely with a viral illness.  No findings on chest x-ray concerning for pneumonia.  Repeat abdominal exam continues to be nontender to palpation.  Did have  some blood in his urine but have a low suspicion for an ongoing obstructing kidney stone given that he has no ongoing pain.  Discussed close follow-up with primary care physician and return precautions to the emergency department for any return of symptoms.     PROCEDURES:  Critical Care performed: No  Procedures  Patient's presentation is most consistent with acute presentation with potential threat to life or bodily function.   MEDICATIONS ORDERED IN ED: Medications  LORazepam  (ATIVAN ) injection 0.5 mg (has no administration in time range)  ondansetron  (ZOFRAN ) injection 4 mg (4 mg Intravenous Given 11/04/23 0817)  potassium chloride  (KLOR-CON ) packet 40 mEq (40 mEq Oral Given 11/04/23 0817)  sodium chloride  0.9 % bolus 500 mL (0 mLs Intravenous Stopped 11/04/23 0913)  LORazepam  (ATIVAN ) tablet 0.5 mg (0.5 mg Oral Given 11/04/23 0817)  sodium chloride  0.9 % bolus 500 mL (0 mLs Intravenous Stopped 11/04/23 1112)    FINAL CLINICAL IMPRESSION(S) / ED DIAGNOSES   Final diagnoses:  Epigastric pain  Nausea and vomiting, unspecified vomiting type  Anxiety  Hypokalemia  Dehydration     Rx / DC Orders   ED Discharge Orders          Ordered    ondansetron  (ZOFRAN -ODT) 4 MG disintegrating tablet  Every 8 hours PRN        11/04/23 1130             Note:  This document was prepared using Dragon voice recognition software and may include unintentional dictation errors.   Suzanne Kirsch, MD 11/04/23 1133

## 2023-11-04 NOTE — ED Notes (Signed)
 Patient requesting to take his home medications; MD Mumma approved. Patient made aware.
